# Patient Record
Sex: Male | Born: 1950 | Race: White | Hispanic: No | Smoking: Never smoker
Health system: Southern US, Community
[De-identification: ages and names within clinical notes are randomized; demographics above are authoritative.]

## PROBLEM LIST (undated history)

## (undated) HISTORY — PX: NO PAST SURGERIES: SHX2092

---

## 2007-01-19 ENCOUNTER — Ambulatory Visit: Payer: Self-pay | Admitting: Gastroenterology

## 2011-09-22 ENCOUNTER — Ambulatory Visit: Payer: Self-pay | Admitting: Family Medicine

## 2013-06-27 IMAGING — CR DG SHOULDER 3+V*L*
1 series · 3 of 3 positions shown · non-contrast
Comparison: none

REASON FOR EXAM: pain strain
COMMENTS:

PROCEDURE:     KDR - KDXR SHOULDER LEFT COMPLETE  - September 22, 2011  [DATE]
RESULT:     Comparison: None.

[Series 1: external rotate · 0.17mm/px · 3 of 3 slices shown]
[im 1/3]
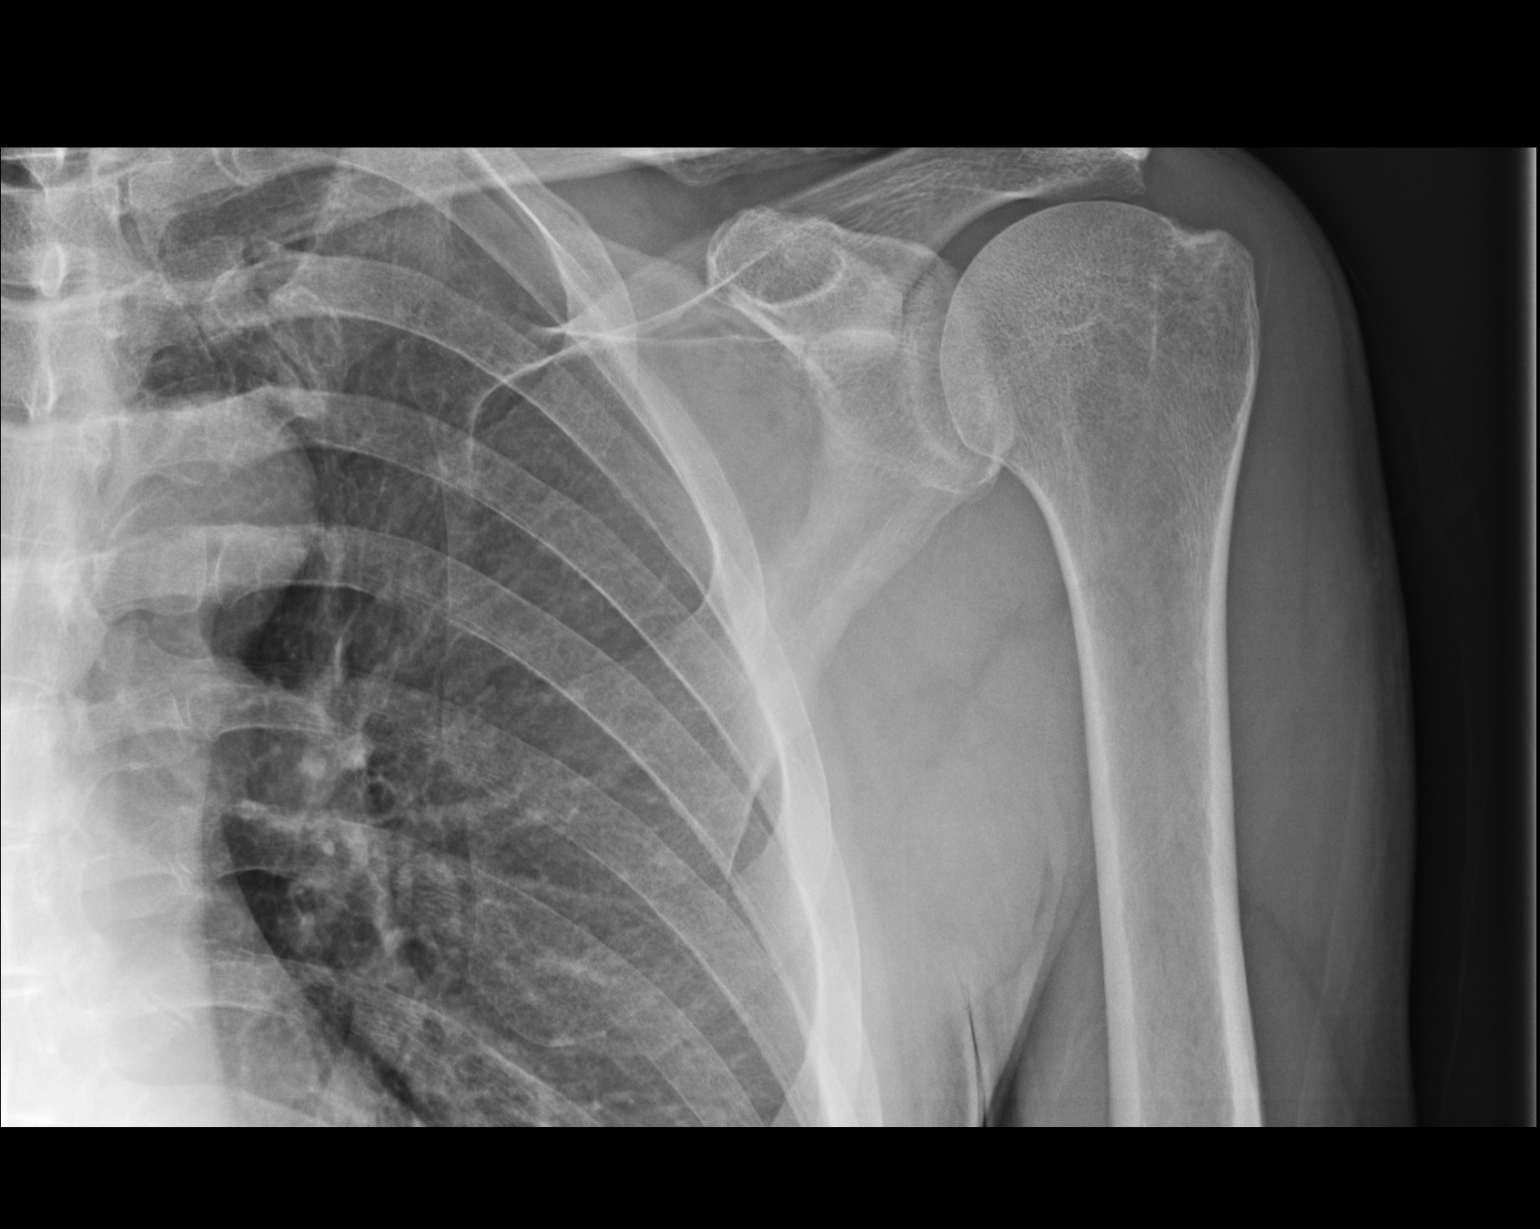
[im 2/3]
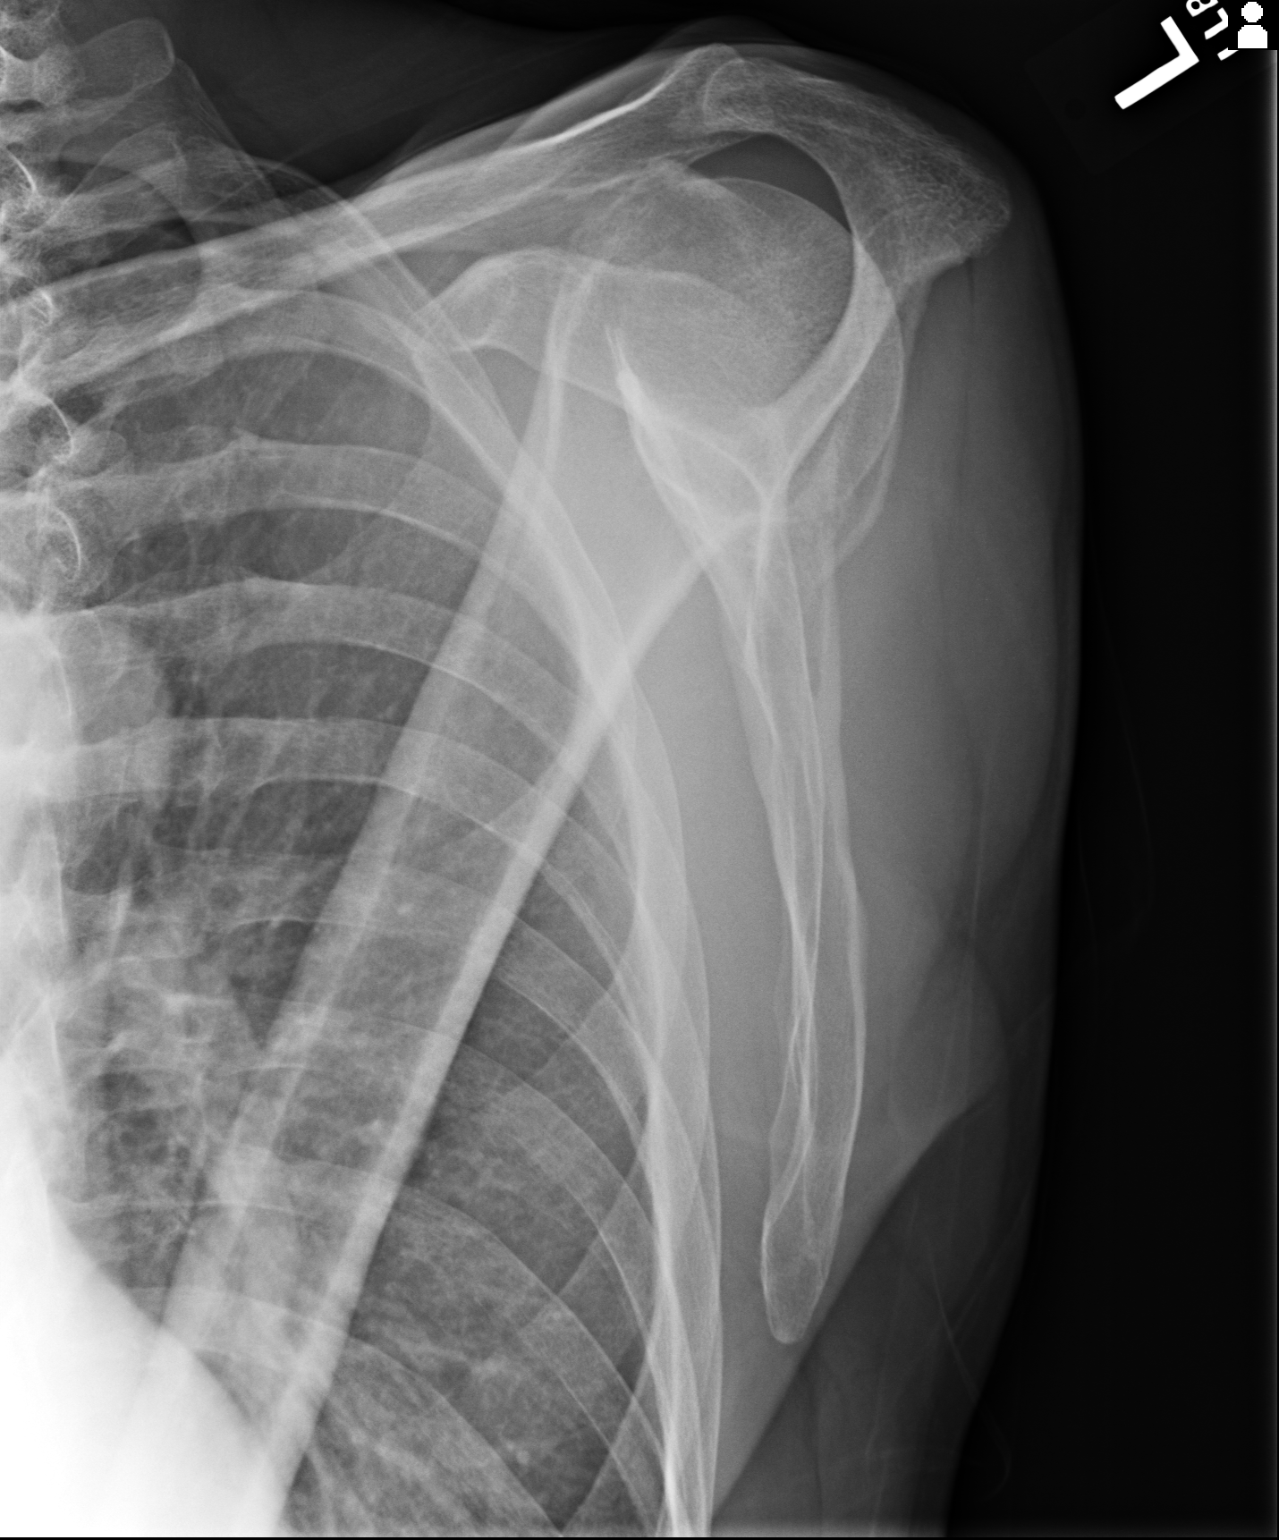
[im 3/3]
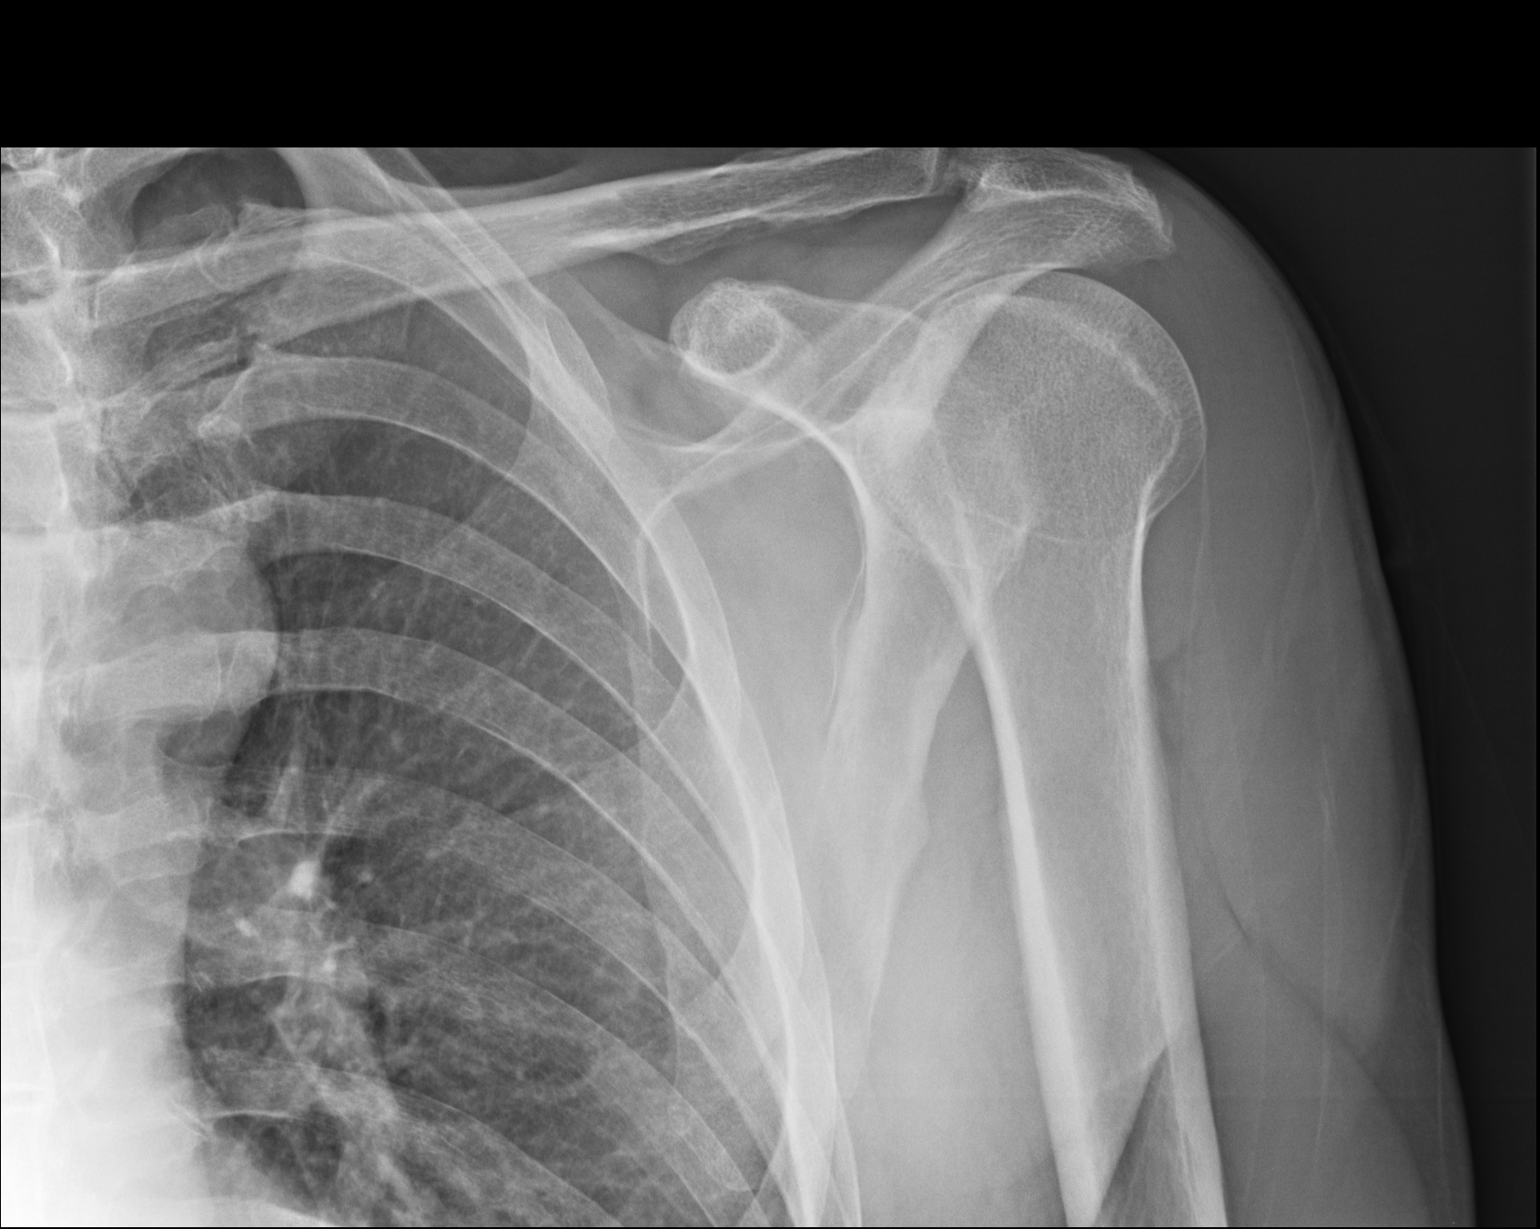

[3 of 3 positions shown; findings below may reference images not displayed]

FINDINGS: There is mild degenerative change of the acromioclavicular joint. No acute
fracture or dislocation.
IMPRESSION: Mild degenerative change of the acromioclavicular joint.

[REDACTED]

## 2013-06-27 IMAGING — CR DG FOREARM 2V*L*
1 series · 2 of 2 positions shown · non-contrast
Comparison: none

REASON FOR EXAM: bony abnormality
COMMENTS:

PROCEDURE:     KDR - KDXR FOREARM LEFT  - September 22, 2011  [DATE]
RESULT:     Comparison: None.

[Series 1: ap · 0.17mm/px · 2 of 2 slices shown]
[im 1/2]
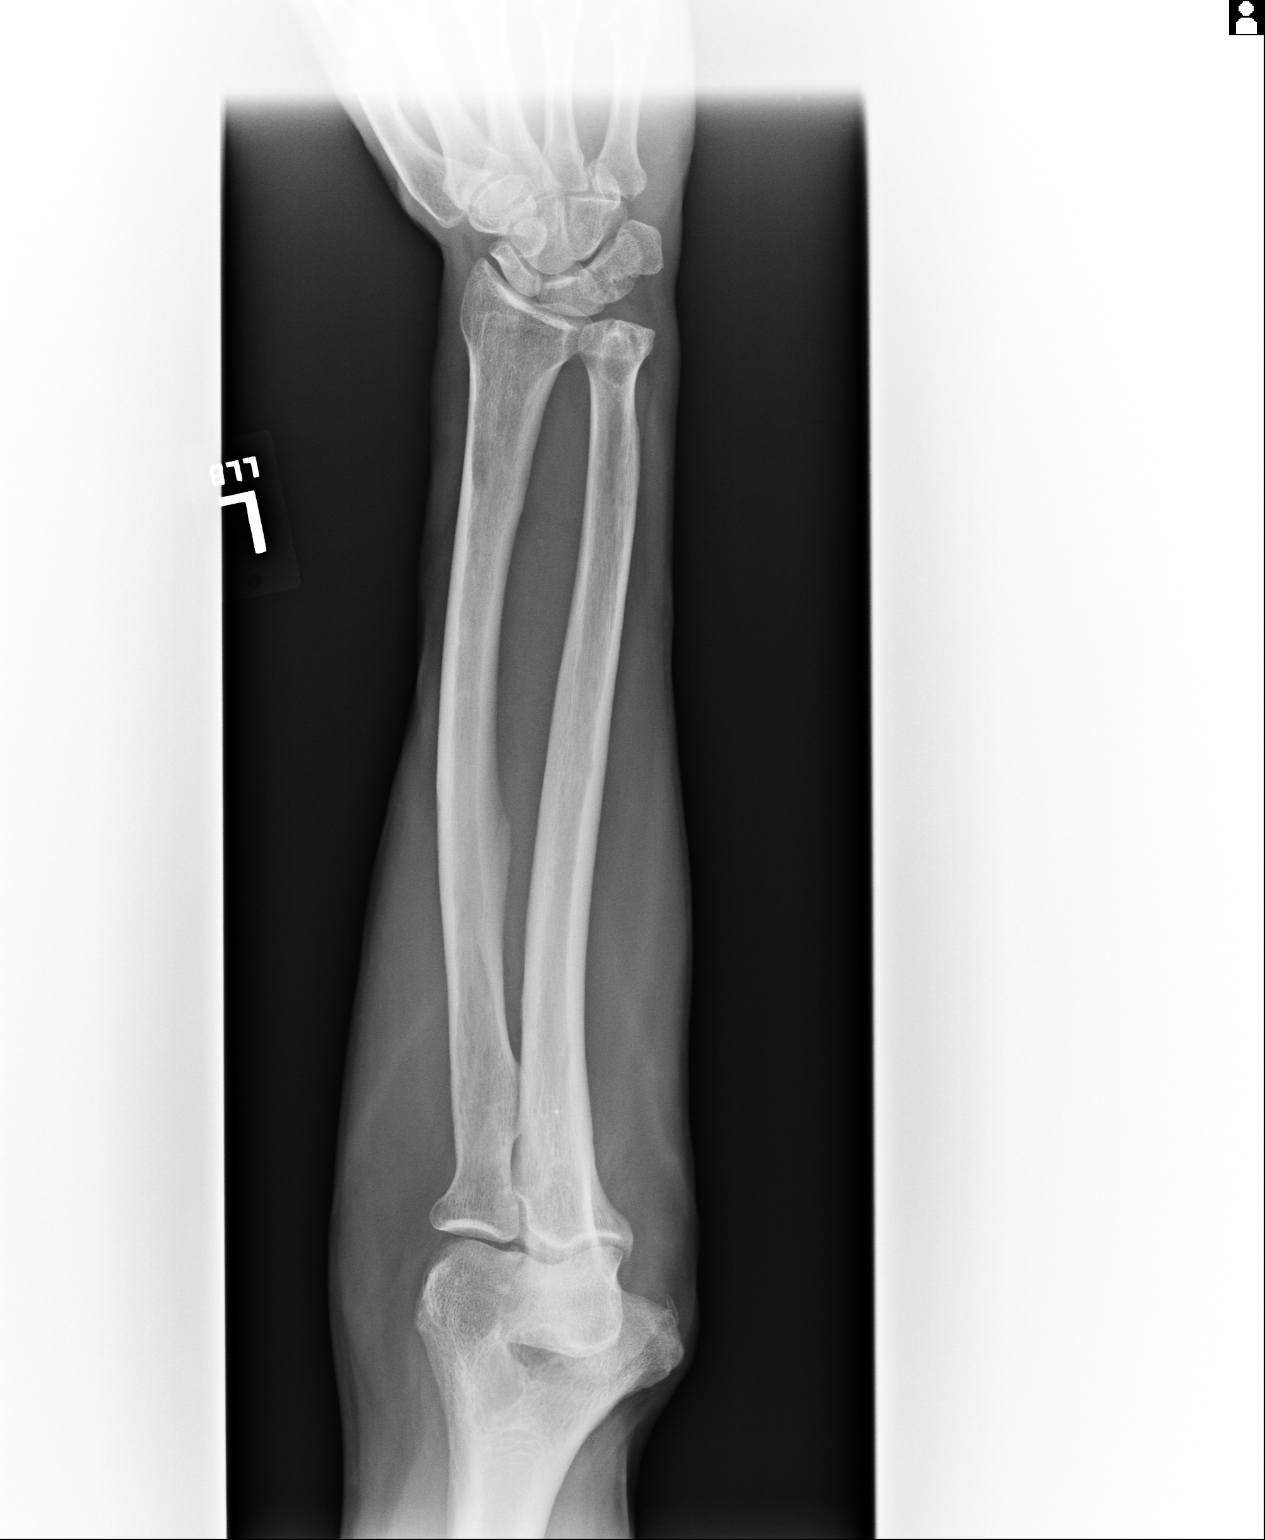
[im 2/2]
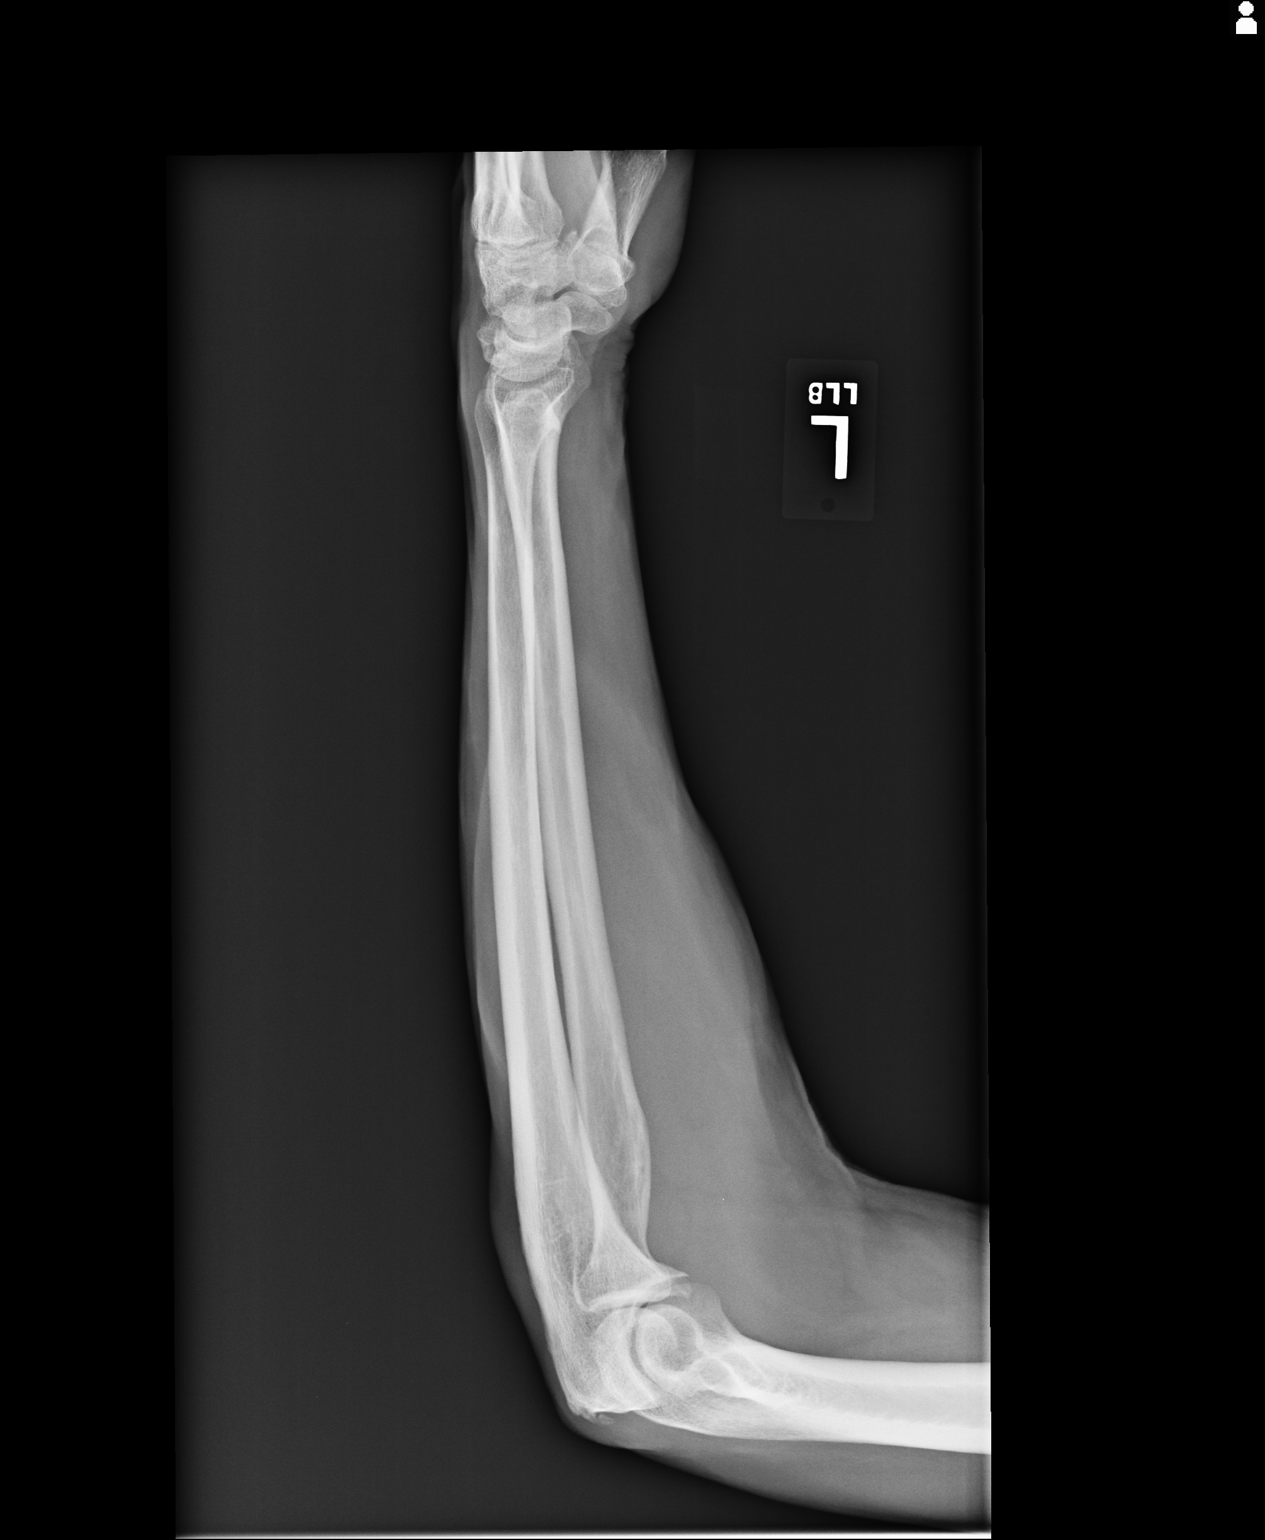

[2 of 2 positions shown; findings below may reference images not displayed]

FINDINGS: No acute fracture. There is some truncation of the ulnar styloid. Round area
of central lucency in the distal ulna is likely related to artifact from
overlying mild proliferative changes. However, if there is clinical concern
referable to this region of the distal ulna, dedicated wrist radiographs
would be recommended.
IMPRESSION: Please see above.

[REDACTED]

## 2015-03-13 ENCOUNTER — Telehealth: Payer: Self-pay | Admitting: Family Medicine

## 2015-03-13 NOTE — Telephone Encounter (Signed)
Pt has not been seen since 09/21/2011 and is requesting to reestablish and get a CPE.  Pt has not seen any other PCP.  No Rx.  [employer].  NW#[phone]/VHCB#[phone]/MW

## 2015-03-14 NOTE — Telephone Encounter (Signed)
Please review. KW 

## 2015-03-14 NOTE — Telephone Encounter (Signed)
Left message for pt to call back/MW °

## 2015-03-14 NOTE — Telephone Encounter (Signed)
Appointment scheduled for 03/21/2015/MW

## 2015-03-14 NOTE — Telephone Encounter (Signed)
Ok to re-establish with 45 minute physical slot

## 2015-03-21 ENCOUNTER — Ambulatory Visit: Payer: Self-pay | Admitting: Family Medicine

## 2015-03-22 ENCOUNTER — Ambulatory Visit (INDEPENDENT_AMBULATORY_CARE_PROVIDER_SITE_OTHER): Payer: 59 | Admitting: Family Medicine

## 2015-03-22 ENCOUNTER — Encounter: Payer: Self-pay | Admitting: Family Medicine

## 2015-03-22 VITALS — BP 110/76 | HR 74 | Temp 98.6°F | Resp 16 | Ht 69.0 in | Wt 157.2 lb

## 2015-03-22 DIAGNOSIS — Z7189 Other specified counseling: Secondary | ICD-10-CM | POA: Diagnosis not present

## 2015-03-22 DIAGNOSIS — Z7689 Persons encountering health services in other specified circumstances: Secondary | ICD-10-CM

## 2015-03-22 NOTE — Patient Instructions (Signed)
Follow up when you are ready for more of a physical, lab work and immunizations.

## 2015-03-22 NOTE — Progress Notes (Signed)
Subjective:     Patient ID: Samuel Solis, male   DOB: 07/09/1950, 65 y.o.   MRN: [id number]  HPI  Chief Complaint  Patient presents with  . New Patient (Initial Visit)    Patient returns back to office to reestablish patient care. Patient states that he has had no PCP since last being seen in our office in 2013, patient reports that he has no questions or concerns that he needs to address today. Patient declined both Td vaccine and Flu, patient states he will update Td vaccine at next office visit.   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 Reports he has two adult daughters and one granddaughter he sees frequently.   Review of Systems General: Feeling well, Defers influenza, Td, and Zostavax. HEENT: regular dental visits; wears reading glasses but no recent exam Cardiovascular: no chest pain, shortness of breath, or palpitations GI: no heartburn, no change in bowel habits. Due screening colonoscopy in 2018. GU: nocturia x 1, no change in bladder habits  Psychiatric: not depressed Musculoskeletal: Left shoulder injury playing hockey in October which is just now starting to feel better. States he fell onto his shoulder but did not seek medical evaluation.    Objective:   Physical Exam  Constitutional: He appears well-developed and well-nourished. No distress.  Cardiovascular: Normal rate and regular rhythm.   Pulmonary/Chest: Breath sounds normal.  Musculoskeletal: He exhibits no edema (of lower extremities).       Assessment:    1. Encounter to establish care     Plan:    Will f/u as needed and for physical when he is ready.

## 2016-06-02 ENCOUNTER — Telehealth: Payer: Self-pay | Admitting: Family Medicine

## 2017-01-05 ENCOUNTER — Ambulatory Visit (INDEPENDENT_AMBULATORY_CARE_PROVIDER_SITE_OTHER): Payer: Self-pay | Admitting: Family Medicine

## 2017-01-05 ENCOUNTER — Encounter: Payer: Self-pay | Admitting: Family Medicine

## 2017-01-05 VITALS — BP 122/68 | HR 61 | Temp 98.0°F | Resp 16 | Wt 154.4 lb

## 2017-01-05 DIAGNOSIS — L84 Corns and callosities: Secondary | ICD-10-CM

## 2017-01-05 NOTE — Patient Instructions (Signed)
Cleanse left foot with soap and water and apply bandaid until small cuts heal.

## 2017-01-05 NOTE — Progress Notes (Signed)
Subjective:     Patient ID: Samuel Solis, male   DOB: 08/08/1950, 66 y.o.   MRN: [id number]  HPI  Chief Complaint  Patient presents with  . Skin Problem    Patient comes in office today with concerns of a possible "bump" on the bottom of his left foot that has been present for the more than 2 months. Patient denies area growing in size but states that it is painful to walk.   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 Likes to play hockey when not working. Has used shoe inserts with improvement in his sx. Defers flu vaccine.  Review of Systems     Objective:   Physical Exam  Constitutional: He appears well-developed and well-nourished. No distress.  Skin:  Left plantar foot with two areas of scaling and tenderness. First is in the first metatarsal area and second is lateral to the first with a core appreciated.  Both areas pared down with a scalpel with two nicks during the procedure. Bleeding controlled with pressure and application of large band-aid.       Assessment:    1. Plantar callus with metatarsalgia     Plan:    cleanse foot with soap and water and apply band-aids until healed. Continue shoe inserts. Consider podiatry evaluation.

## 2017-01-08 ENCOUNTER — Telehealth: Payer: Self-pay | Admitting: Family Medicine

## 2017-01-08 NOTE — Telephone Encounter (Signed)
Pt stated he was returning Bob's call and wanted to let Nadine CountsBob know his feet feel much better and are doing just fine. Please advise. Thanks TNP

## 2017-01-08 NOTE — Telephone Encounter (Signed)
See below please-Samuel Solis V [name], RMA  

## 2017-01-26 ENCOUNTER — Ambulatory Visit: Payer: Self-pay | Admitting: Family Medicine

## 2017-01-26 ENCOUNTER — Encounter: Payer: Self-pay | Admitting: Family Medicine

## 2017-01-26 VITALS — BP 126/76 | HR 69 | Temp 98.0°F | Resp 16 | Wt 154.6 lb

## 2017-01-26 DIAGNOSIS — R202 Paresthesia of skin: Secondary | ICD-10-CM

## 2017-01-26 LAB — POCT GLYCOSYLATED HEMOGLOBIN (HGB A1C): Hemoglobin A1C: 5.4

## 2017-01-26 NOTE — Patient Instructions (Signed)
Try  Eucerin cream mixed in equal proportions with clotrimazole cream twice daily.

## 2017-01-26 NOTE — Progress Notes (Signed)
Subjective:     Patient ID: Samuel Solis, male   DOB: 12/10/1950, 66 y.o.   MRN: [id number] HPI  Chief Complaint  Patient presents with  . Foot Pain    Patient returns to office for follow up, patient was last seen 01/05/17 with plantar callus with metatarsalgia. Patient states since warts were removed from foot he noticed slight improvement but notices now that he has a burning sensation in his foot.   Denies injury or hx of diabetes.   Review of Systems     Objective:   Physical Exam  Constitutional: He appears well-developed and well-nourished. No distress.  Skin:  Left plantar foot with mild tenderness over the third and fourth metatarsals. There is also skin scaling/sloughing across his metatarsal area.       Assessment:    1. Paresthesias: left foot - POCT glycosylated hemoglobin (Hb A1C)    Plan:    Try antifungal combined with a moisturizing cream. Deferx-ray at this time per patient preference.

## 2017-02-11 ENCOUNTER — Encounter: Payer: Self-pay | Admitting: Podiatry

## 2017-02-11 ENCOUNTER — Ambulatory Visit (INDEPENDENT_AMBULATORY_CARE_PROVIDER_SITE_OTHER): Payer: Medicare Other | Admitting: Podiatry

## 2017-02-11 VITALS — BP 110/74 | HR 61

## 2017-02-11 DIAGNOSIS — T25122A Burn of first degree of left foot, initial encounter: Secondary | ICD-10-CM

## 2017-02-11 DIAGNOSIS — Q828 Other specified congenital malformations of skin: Secondary | ICD-10-CM

## 2017-02-11 NOTE — Progress Notes (Signed)
   Subjective:    Patient ID: Samuel JordanJohn T Mohar, male    DOB: 08/30/1950, 66 y.o.   MRN: [id number]  HPI this patient presents to the office with chief complaint of pain noted on the bottom of his left forefoot.  He states that he is been having problems with this forefoot pain for approximately 2 months.  He says it started out as a painful callus are he applied liquid to the skin. He says that forefoot area. Then started burning.  He went to his medical doctor who debrided some tissue from his left forefoot which helped improve his foot.  He says that the pain has now returned and he presents the office today with drainage tion and treatment of his feet.     Review of Systems  All other systems reviewed and are negative.      Objective:   Physical Exam General Appearance  Alert, conversant and in no acute stress.  Vascular  Dorsalis pedis and posterior pulses are palpable  bilaterally.  Capillary return is within normal limits  Bilaterally. Temperature is within normal limits  Bilaterally  Neurologic  Senn-Weinstein monofilament wire test within normal limits  bilaterally. Muscle power  Within normal limits bilaterally.  Nails Thick disfigured discolored nails with subungual debride bilaterally from hallux to fifth toes bilaterally. No evidence of bacterial infection or drainage bilaterally.  Orthopedic  No limitations of motion of motion feet bilaterally.  No crepitus or effusions noted.  No bony pathology or digital deformities noted.  Skin  Significant peeling and erythema noted on the forefoot bilaterally.  Painful callus area on the left forefoot.  No evidence of any infection or drainage noted.        Assessment & Plan:  Burn forefeet  IE  Debride necrotic tissue/callus left forefoot..  Silvadene/DSD/padding  Left forefoot.  RTC prn   Helane GuntherGregory Judia Arnott DPM

## 2020-07-03 ENCOUNTER — Other Ambulatory Visit: Payer: Self-pay

## 2020-07-03 ENCOUNTER — Encounter: Payer: Self-pay | Admitting: Family Medicine

## 2020-07-03 ENCOUNTER — Ambulatory Visit (INDEPENDENT_AMBULATORY_CARE_PROVIDER_SITE_OTHER): Payer: Self-pay | Admitting: Family Medicine

## 2020-07-03 VITALS — BP 136/81 | HR 62 | Temp 98.5°F | Ht 71.0 in | Wt 175.0 lb

## 2020-07-03 DIAGNOSIS — Z0000 Encounter for general adult medical examination without abnormal findings: Secondary | ICD-10-CM

## 2020-07-03 DIAGNOSIS — L600 Ingrowing nail: Secondary | ICD-10-CM

## 2020-07-03 DIAGNOSIS — Z1211 Encounter for screening for malignant neoplasm of colon: Secondary | ICD-10-CM

## 2020-07-03 DIAGNOSIS — Z6824 Body mass index (BMI) 24.0-24.9, adult: Secondary | ICD-10-CM

## 2020-07-03 NOTE — Patient Instructions (Addendum)
Needs Pneumonia and tetanus vaccine at pharmacy    Health Maintenance After Age 70 After age 2, you are at a higher risk for certain long-term diseases and infections as well as injuries from falls. Falls are a major cause of broken bones and head injuries in people who are older than age 22. Getting regular preventive care can help to keep you healthy and well. Preventive care includes getting regular testing and making lifestyle changes as recommended by your health care provider. Talk with your health care provider about:  Which screenings and tests you should have. A screening is a test that checks for a disease when you have no symptoms.  A diet and exercise plan that is right for you. What should I know about screenings and tests to prevent falls? Screening and testing are the best ways to find a health problem early. Early diagnosis and treatment give you the best chance of managing medical conditions that are common after age 58. Certain conditions and lifestyle choices may make you more likely to have a fall. Your health care provider may recommend:  Regular vision checks. Poor vision and conditions such as cataracts can make you more likely to have a fall. If you wear glasses, make sure to get your prescription updated if your vision changes.  Medicine review. Work with your health care provider to regularly review all of the medicines you are taking, including over-the-counter medicines. Ask your health care provider about any side effects that may make you more likely to have a fall. Tell your health care provider if any medicines that you take make you feel dizzy or sleepy.  Osteoporosis screening. Osteoporosis is a condition that causes the bones to get weaker. This can make the bones weak and cause them to break more easily.  Blood pressure screening. Blood pressure changes and medicines to control blood pressure can make you feel dizzy.  Strength and balance checks. Your health  care provider may recommend certain tests to check your strength and balance while standing, walking, or changing positions.  Foot health exam. Foot pain and numbness, as well as not wearing proper footwear, can make you more likely to have a fall.  Depression screening. You may be more likely to have a fall if you have a fear of falling, feel emotionally low, or feel unable to do activities that you used to do.  Alcohol use screening. Using too much alcohol can affect your balance and may make you more likely to have a fall. What actions can I take to lower my risk of falls? General instructions  Talk with your health care provider about your risks for falling. Tell your health care provider if: ? You fall. Be sure to tell your health care provider about all falls, even ones that seem minor. ? You feel dizzy, sleepy, or off-balance.  Take over-the-counter and prescription medicines only as told by your health care provider. These include any supplements.  Eat a healthy diet and maintain a healthy weight. A healthy diet includes low-fat dairy products, low-fat (lean) meats, and fiber from whole grains, beans, and lots of fruits and vegetables. Home safety  Remove any tripping hazards, such as rugs, cords, and clutter.  Install safety equipment such as grab bars in bathrooms and safety rails on stairs.  Keep rooms and walkways well-lit. Activity  Follow a regular exercise program to stay fit. This will help you maintain your balance. Ask your health care provider what types of exercise are appropriate  for you.  If you need a cane or walker, use it as recommended by your health care provider.  Wear supportive shoes that have nonskid soles.   Lifestyle  Do not drink alcohol if your health care provider tells you not to drink.  If you drink alcohol, limit how much you have: ? 0-1 drink a day for women. ? 0-2 drinks a day for men.  Be aware of how much alcohol is in your drink. In  the [place] one drink equals one typical bottle of beer (12 oz), one-half glass of wine (5 oz), or one shot of hard liquor (1 oz).  Do not use any products that contain nicotine or tobacco, such as cigarettes and e-cigarettes. If you need help quitting, ask your health care provider. Summary  Having a healthy lifestyle and getting preventive care can help to protect your health and wellness after age 53.  Screening and testing are the best way to find a health problem early and help you avoid having a fall. Early diagnosis and treatment give you the best chance for managing medical conditions that are more common for people who are older than age 70.  Falls are a major cause of broken bones and head injuries in people who are older than age 10. Take precautions to prevent a fall at home.  Work with your health care provider to learn what changes you can make to improve your health and wellness and to prevent falls. This information is not intended to replace advice given to you by your health care provider. Make sure you discuss any questions you have with your health care provider. Document Revised: 06/16/2018 Document Reviewed: 01/06/2017 Elsevier Patient Education  2021 [employer].

## 2020-07-03 NOTE — Progress Notes (Signed)
Annual Wellness Visit     Patient: Samuel Solis, Male    DOB: 28-Nov-1950, 70 y.o.   MRN: [id number] Visit Date: 07/03/2020  Today's Provider: Azalee Course Tyrae Alcoser, FNP   Chief Complaint  Patient presents with  . Annual Exam   Subjective    Samuel Solis is a 70 y.o. male who presents today for his Annual Wellness Visit. He reports consuming a general diet.  He generally feels well. He reports sleeping well. He does have additional problems to discuss today.   HPI   Pt is concerned his right big toe nail is ingrown  Denies any specialists, UC, ED, or surgeries Has not been to the dentist or eye doctor in the past years  Had a colonoscopy 5 years ago Was told to go in 5 years again, not sure why  Not sure if had chicken pox, will check antibody to see if a candidate for shingles vaccine  Works at [employer]  There are no problems to display for this patient.  History reviewed. No pertinent past medical history. Social History   Tobacco Use  . Smoking status: Never Smoker  . Smokeless tobacco: Never Used  Vaping Use  . Vaping Use: Never used  Substance Use Topics  . Alcohol use: No    Alcohol/week: 0.0 standard drinks  . Drug use: No   No Known Allergies   Medications: No outpatient medications prior to visit.   No facility-administered medications prior to visit.    No Known Allergies  Patient Care Team: Chrismon, Jodell Cipro, PA-C as PCP - General (Family Medicine)  Review of Systems  Constitutional: Negative.   HENT: Negative.   Eyes: Negative.   Respiratory: Negative.   Cardiovascular: Negative.   Gastrointestinal: Negative.   Endocrine: Negative.   Genitourinary: Negative.   Musculoskeletal: Negative.   Skin: Negative.   Allergic/Immunologic: Negative.   Neurological: Negative.   Hematological: Negative.   Psychiatric/Behavioral: Negative.         Objective    Vitals: BP 136/81 (BP Location: Right Arm, Patient Position: Sitting, Cuff Size:  Large)   Pulse 62   Temp 98.5 F (36.9 C) (Oral)   Ht 5\' 11"  (1.803 m)   Wt 175 lb (79.4 kg)   BMI 24.41 kg/m    Physical Exam Constitutional:      General: He is not in acute distress.    Appearance: Normal appearance. He is normal weight. He is not ill-appearing.  HENT:     Head: Normocephalic.     Right Ear: Tympanic membrane and ear canal normal.     Left Ear: Tympanic membrane and ear canal normal.     Nose: Nose normal.     Mouth/Throat:     Mouth: Mucous membranes are moist.     Dentition: Normal dentition.     Pharynx: Oropharynx is clear. No pharyngeal swelling, oropharyngeal exudate or posterior oropharyngeal erythema.  Eyes:     Extraocular Movements: Extraocular movements intact.     Conjunctiva/sclera: Conjunctivae normal.     Pupils: Pupils are equal, round, and reactive to light.  Neck:     Thyroid: No thyroid mass, thyromegaly or thyroid tenderness.  Cardiovascular:     Rate and Rhythm: Normal rate and regular rhythm.     Pulses: Normal pulses.     Heart sounds: Normal heart sounds. No murmur heard. No friction rub. No gallop.   Pulmonary:     Effort: Pulmonary effort is normal. No respiratory distress.  Breath sounds: Normal breath sounds.  Abdominal:     General: Bowel sounds are normal.     Palpations: Abdomen is soft.     Tenderness: There is no abdominal tenderness.     Hernia: No hernia is present.  Musculoskeletal:        General: No tenderness, deformity or signs of injury. Normal range of motion.     Cervical back: Normal range of motion.  Feet:     Right foot:     Skin integrity: Erythema (on Great toe) present.     Toenail Condition: Right toenails are ingrown.     Left foot:     Toenail Condition: Left toenails are normal.  Lymphadenopathy:     Cervical: No cervical adenopathy.  Skin:    General: Skin is warm and dry.  Neurological:     Mental Status: He is alert and oriented to person, place, and time.  Psychiatric:        Mood  and Affect: Mood normal.        Behavior: Behavior normal.      Most recent functional status assessment: In your present state of health, do you have any difficulty performing the following activities: 07/03/2020  Hearing? N  Vision? N  Difficulty concentrating or making decisions? N  Walking or climbing stairs? N  Dressing or bathing? N  Doing errands, shopping? N  Some recent data might be hidden   Most recent fall risk assessment: Fall Risk  07/03/2020  Falls in the past year? 0  Number falls in past yr: 0  Injury with Fall? 0  Risk for fall due to : No Fall Risks  Follow up Falls evaluation completed    Most recent depression screenings: PHQ 2/9 Scores 07/03/2020  PHQ - 2 Score 0  PHQ- 9 Score 0   Most recent cognitive screening: No flowsheet data found. Most recent Audit-C alcohol use screening Alcohol Use Disorder Test (AUDIT) 07/03/2020  1. How often do you have a drink containing alcohol? 0  2. How many drinks containing alcohol do you have on a typical day when you are drinking? 0  3. How often do you have six or more drinks on one occasion? 0  AUDIT-C Score 0   A score of 3 or more in women, and 4 or more in men indicates increased risk for alcohol abuse, EXCEPT if all of the points are from question 1   No results found for any visits on 07/03/20.  Assessment & Plan     Annual wellness visit done today including the all of the following: Reviewed patient's Family Medical History Reviewed and updated list of patient's medical providers Assessment of cognitive impairment was done Assessed patient's functional ability Established a written schedule for health screening services Health Risk Assessent Completed and Reviewed  Exercise Activities and Dietary recommendations Goals   None     Immunization History  Administered Date(s) Administered  . Moderna SARS-COV2 Booster Vaccination 05/27/2019, 06/24/2019, 02/08/2020  . Tdap 10/27/2004    Health  Maintenance  Topic Date Due  . COLONOSCOPY (Pts 45-18yrs Insurance coverage will need to be confirmed)  Never done  . TETANUS/TDAP  10/28/2014  . PNA vac Low Risk Adult (1 of 2 - PCV13) Never done  . INFLUENZA VACCINE  10/07/2020  . COVID-19 Vaccine  Completed  . Hepatitis C Screening  Completed  . HPV VACCINES  Aged Out     Discussed health benefits of physical activity, and encouraged him to  engage in regular exercise appropriate for his age and condition.    Problem List Items Addressed This Visit   None   Visit Diagnoses    Annual physical exam    -  Primary   Relevant Orders   CBC with Differential   Comprehensive metabolic panel   Lipid Panel   TSH   Hepatitis C antibody   PSA   Varicella zoster antibody, IgG   Body mass index (BMI) 24.0-24.9, adult        Relevant Orders   CBC with Differential   Colon cancer screening       Relevant Orders   Ambulatory referral to Gastroenterology   Ingrown toenail of right foot       Relevant Orders   Ambulatory referral to Podiatry     Plan  Encouraged to get TDAP and pneumonia vaccine at pharmacy  Referral placed to GI for colonoscopy  Referral placed to podiatry for ingrown toenail: continue soaks and antibiotic ointment  Will follow up with lab results  Will follow up if needs shingrix  Encouraged to go to the dentist and eye doctor  Return in about 6 months (around 01/02/2021).       Azalee Course Lewis Keats, FNP  [facility] [phone] (phone) [phone] (fax)  [facility]

## 2020-07-04 ENCOUNTER — Encounter: Payer: Self-pay | Admitting: Family Medicine

## 2020-07-22 ENCOUNTER — Encounter: Payer: Self-pay | Admitting: Podiatry

## 2020-07-22 ENCOUNTER — Other Ambulatory Visit: Payer: Self-pay

## 2020-07-22 ENCOUNTER — Ambulatory Visit (INDEPENDENT_AMBULATORY_CARE_PROVIDER_SITE_OTHER): Payer: 59 | Admitting: Podiatry

## 2020-07-22 DIAGNOSIS — L600 Ingrowing nail: Secondary | ICD-10-CM | POA: Diagnosis not present

## 2020-07-22 MED ORDER — CEPHALEXIN 500 MG PO CAPS: 500.0000 mg | ORAL_CAPSULE | Freq: Three times a day (TID) | ORAL | 0 refills | Status: AC

## 2020-07-22 NOTE — Progress Notes (Signed)
  Subjective:  Patient ID: Samuel Solis, male    DOB: 1950-04-03,  MRN: [id number]  Chief Complaint  Patient presents with  . Ingrown Toenail    Patient presents today for ingrown toenail left hallux bilat borders x 3-4 weeks    70 y.o. male presents with the above complaint. History confirmed with patient.   Objective:  Physical Exam: warm, good capillary refill, no trophic changes or ulcerative lesions, normal DP and PT pulses and normal sensory exam. Left Foot: Ingrown medial hallux with paronychia and large granuloma Assessment:   1. Ingrowing left great toenail      Plan:  Patient was evaluated and treated and all questions answered.    Ingrown Nail, left -Patient elects to proceed with minor surgery to remove ingrown toenail today. Consent reviewed and signed by patient. -Ingrown nail excised. See procedure note. -Educated on post-procedure care including soaking. Written instructions provided and reviewed. -Rx for Keflex 5-day sent to pharmacy  Procedure: Excision of Ingrown Toenail Location: Left 1st toe medial nail borders. Anesthesia: Lidocaine 1% plain; 1.5 mL and Marcaine 0.5% plain; 1.5 mL, digital block. Skin Prep: Betadine. Dressing: Silvadene; telfa; dry, sterile, compression dressing. Technique: Following skin prep, the toe was exsanguinated and a tourniquet was secured at the base of the toe. The affected nail border was freed, split with a nail splitter, and excised.  The tourniquet was then removed and sterile dressing applied. Disposition: Patient tolerated procedure well.   Return if symptoms worsen or fail to improve.

## 2020-07-22 NOTE — Patient Instructions (Addendum)

## 2020-08-01 ENCOUNTER — Other Ambulatory Visit: Payer: Self-pay

## 2020-08-01 ENCOUNTER — Encounter: Payer: Self-pay | Admitting: Nurse Practitioner

## 2020-08-01 ENCOUNTER — Ambulatory Visit (INDEPENDENT_AMBULATORY_CARE_PROVIDER_SITE_OTHER): Payer: Self-pay | Admitting: Nurse Practitioner

## 2020-08-01 VITALS — BP 133/81 | HR 77 | Temp 97.6°F | Ht 69.61 in | Wt 171.0 lb

## 2020-08-01 DIAGNOSIS — R1909 Other intra-abdominal and pelvic swelling, mass and lump: Secondary | ICD-10-CM

## 2020-08-01 NOTE — Progress Notes (Signed)
Acute Office Visit  Subjective:    Patient ID: Samuel Solis, male    DOB: [birth year], 70 y.o.   MRN: [id number]  Chief Complaint  Patient presents with  . knot in groin    For 1 week, no pain     HPI Patient is in today for a knot in his left groin.  GROIN SWELLING  Duration: 1 week Dysuria: no Urinary frequency: yes Urgency: yes--not new Small volume voids: no Symptom severity: mild Urinary incontinence: no Foul odor: no Hematuria: no Abdominal pain:  no Back pain: no Suprapubic pain/pressure: no Flank pain: no Fever:  no Vomiting: no Penile discharge: no   History reviewed. No pertinent past medical history.  Past Surgical History:  Procedure Laterality Date  . NO PAST SURGERIES      Family History  Problem Relation Age of Onset  . Cancer Mother        lung  . Stroke Father   . Healthy Sister   . Healthy Brother   . Healthy Sister   . Healthy Brother   . Colon cancer Neg Hx   . Prostate cancer Neg Hx     Social History   Socioeconomic History  . Marital status: Divorced    Spouse name: Not on file  . Number of children: Not on file  . Years of education: Not on file  . Highest education level: Not on file  Occupational History  . Not on file  Tobacco Use  . Smoking status: Never Smoker  . Smokeless tobacco: Never Used  Vaping Use  . Vaping Use: Never used  Substance and Sexual Activity  . Alcohol use: No    Alcohol/week: 0.0 standard drinks  . Drug use: No  . Sexual activity: Never  Other Topics Concern  . Not on file  Social History Narrative  . Not on file   Social Determinants of Health   Financial Resource Strain: Not on file  Food Insecurity: Not on file  Transportation Needs: Not on file  Physical Activity: Not on file  Stress: Not on file  Social Connections: Not on file  Intimate Partner Violence: Not on file    No outpatient medications prior to visit.   No facility-administered medications prior to visit.     No Known Allergies  Review of Systems  Constitutional: Negative.   Respiratory: Negative.   Cardiovascular: Negative.   Genitourinary: Positive for frequency. Negative for hematuria, penile discharge, scrotal swelling and testicular pain.       Area of swelling to left groin, see HPI  Neurological: Negative.        Objective:    Physical Exam Vitals and nursing note reviewed. Exam conducted with a chaperone present.  Constitutional:      Appearance: Normal appearance.  HENT:     Head: Normocephalic.  Eyes:     Conjunctiva/sclera: Conjunctivae normal.  Musculoskeletal:     Cervical back: Normal range of motion.  Skin:    General: Skin is warm.     Comments: 6cm x 6cm area of edema to left pelvis. Not tender to touch. No erythema or warmth.  Neurological:     General: No focal deficit present.     Mental Status: He is alert and oriented to person, place, and time.  Psychiatric:        Mood and Affect: Mood normal.        Behavior: Behavior normal.        Thought Content: Thought content  normal.        Judgment: Judgment normal.     BP 133/81   Pulse 77   Temp 97.6 F (36.4 C) (Oral)   Ht 5' 9.61" (1.768 m)   Wt 171 lb (77.6 kg)   SpO2 95%   BMI 24.81 kg/m  Wt Readings from Last 3 Encounters:  08/01/20 171 lb (77.6 kg)  07/03/20 175 lb (79.4 kg)  01/26/17 154 lb 9.6 oz (70.1 kg)    There are no preventive care reminders to display for this patient.  There are no preventive care reminders to display for this patient.   No results found for: TSH No results found for: WBC, HGB, HCT, MCV, PLT No results found for: NA, K, CHLORIDE, CO2, GLUCOSE, BUN, CREATININE, BILITOT, ALKPHOS, AST, ALT, PROT, ALBUMIN, CALCIUM, ANIONGAP, EGFR, GFR No results found for: CHOL No results found for: HDL No results found for: LDLCALC No results found for: TRIG No results found for: CHOLHDL Lab Results  Component Value Date   HGBA1C 5.4 01/26/2017       Assessment &  Plan:   Problem List Items Addressed This Visit      Other   Lump in the groin - Primary    Area of edema noted to left pelvis. No tenderness, erythema, or warmth noted. He can take ibuprofen and use ice/heat to site. Will place order for ultrasound. Follow-up with PCP if symptoms worsen or do not improve.       Relevant Orders   US Abdomen Limited       No orders of the defined types were placed in this encounter.    Charyl Dancer, NP

## 2020-08-01 NOTE — Assessment & Plan Note (Signed)
Area of edema noted to left pelvis. No tenderness, erythema, or warmth noted. He can take ibuprofen and use ice/heat to site. Will place order for ultrasound. Follow-up with PCP if symptoms worsen or do not improve.

## 2020-08-01 NOTE — Patient Instructions (Signed)
It was great to see you!  You can try ice/heat along with ibuprofen. We will get an ultrasound of the area.  Take care,  Rodman Pickle, NP

## 2020-08-07 DIAGNOSIS — K4090 Unilateral inguinal hernia, without obstruction or gangrene, not specified as recurrent: Secondary | ICD-10-CM

## 2020-08-07 HISTORY — DX: Unilateral inguinal hernia, without obstruction or gangrene, not specified as recurrent: K40.90

## 2020-09-10 ENCOUNTER — Ambulatory Visit: Admission: RE | Admit: 2020-09-10 | Payer: Self-pay | Source: Ambulatory Visit

## 2020-09-10 ENCOUNTER — Other Ambulatory Visit: Payer: Self-pay

## 2020-09-17 ENCOUNTER — Encounter: Payer: Self-pay | Admitting: Emergency Medicine

## 2020-09-17 ENCOUNTER — Other Ambulatory Visit: Payer: Self-pay

## 2020-09-17 ENCOUNTER — Ambulatory Visit
Admission: EM | Admit: 2020-09-17 | Discharge: 2020-09-17 | Disposition: A | Discharge status: Home / Self Care | Payer: 59

## 2020-09-17 DIAGNOSIS — R1909 Other intra-abdominal and pelvic swelling, mass and lump: Secondary | ICD-10-CM | POA: Diagnosis not present

## 2020-09-17 NOTE — Discharge Instructions (Addendum)
Go to [facility] radiology for the ultrasound of your abdomen tomorrow morning at 8:45 am.      Do not eat or drink anything after midnight.

## 2020-09-17 NOTE — ED Provider Notes (Signed)
Samuel Solis    CSN: [id number] Arrival date & time: 09/17/20  1446      History   Chief Complaint Chief Complaint  Patient presents with   Abdominal Pain     HPI Samuel Solis is a 70 y.o. male.  Patient presents with 1-month history of soft swelling in his left lower abdomen/groin.  No falls or trauma.  He states the area is larger when he is in a standing position.  It is occasionally mildly uncomfortable but no acute pain currently.  He denies redness, wound, rash, fever, chills, abdominal pain, nausea, vomiting, diarrhea, constipation, dysuria, hematuria, penile discharge, testicular pain, or other symptoms.  He was seen by his PCP on 08/01/2020; diagnosed with lump in groin; treated symptomatically and PCP noted that they would order an ultrasound.Marland Kitchen  Ultrasound appointment appears to have been scheduled for 09/10/2020 but patient did not show for the appointment.  He states he did not know about the ultrasound appointment.  He denies other pertinent medical history.  The history is provided by the patient and medical records.   History reviewed. No pertinent past medical history.  Patient Active Problem List   Diagnosis Date Noted   Lump in the groin 08/01/2020    Past Surgical History:  Procedure Laterality Date   NO PAST SURGERIES         Home Medications    Prior to Admission medications   Not on File    Family History Family History  Problem Relation Age of Onset   Cancer Mother        lung   Stroke Father    Healthy Sister    Healthy Brother    Healthy Sister    Healthy Brother    Colon cancer Neg Hx    Prostate cancer Neg Hx     Social History Social History   Tobacco Use   Smoking status: Never   Smokeless tobacco: Never  Vaping Use   Vaping Use: Never used  Substance Use Topics   Alcohol use: No    Alcohol/week: 0.0 standard drinks   Drug use: No     Allergies   Patient has no known allergies.   Review of Systems Review  of Systems  Constitutional:  Negative for chills and fever.  Respiratory:  Negative for cough and shortness of breath.   Cardiovascular:  Negative for chest pain and palpitations.  Gastrointestinal:  Negative for abdominal pain, constipation, diarrhea and vomiting.  Genitourinary:  Positive for scrotal swelling. Negative for dysuria, hematuria, penile discharge and testicular pain.  Skin:  Negative for color change, rash and wound.  All other systems reviewed and are negative.   Physical Exam Triage Vital Signs ED Triage Vitals  Enc Vitals Group     BP      Pulse      Resp      Temp      Temp src      SpO2      Weight      Height      Head Circumference      Peak Flow      Pain Score      Pain Loc      Pain Edu?      Excl. in GC?    No data found.  Updated Vital Signs BP (!) 148/80 (BP Location: Left Arm)   Pulse 70   Temp 99.4 F (37.4 C) (Oral)   Resp 18   SpO2  94%   Visual Acuity Right Eye Distance:   Left Eye Distance:   Bilateral Distance:    Right Eye Near:   Left Eye Near:    Bilateral Near:     Physical Exam Vitals and nursing note reviewed.  Constitutional:      General: He is not in acute distress.    Appearance: He is well-developed.  HENT:     Head: Normocephalic and atraumatic.     Mouth/Throat:     Mouth: Mucous membranes are moist.  Eyes:     Conjunctiva/sclera: Conjunctivae normal.  Cardiovascular:     Rate and Rhythm: Normal rate and regular rhythm.     Heart sounds: Normal heart sounds.  Pulmonary:     Effort: Pulmonary effort is normal. No respiratory distress.     Breath sounds: Normal breath sounds.  Abdominal:     General: Bowel sounds are normal. There is no distension.     Palpations: Abdomen is soft.     Tenderness: There is no abdominal tenderness. There is no guarding or rebound.       Comments: Approximately 6 x 8 cm area of soft, nontender, flesh-colored swelling in left lower abdomen / groin which increases in  diameter when in standing position.  No rash, erythema, wounds, ecchymosis.   Musculoskeletal:     Cervical back: Neck supple.  Skin:    General: Skin is warm and dry.  Neurological:     General: No focal deficit present.     Mental Status: He is alert and oriented to person, place, and time.     Gait: Gait normal.  Psychiatric:        Mood and Affect: Mood normal.        Behavior: Behavior normal.     UC Treatments / Results  Labs (all labs ordered are listed, but only abnormal results are displayed) Labs Reviewed - No data to display  EKG   Radiology US PELVIS LIMITED (TRANSABDOMINAL ONLY)  Result Date: 09/18/2020 CLINICAL DATA:  Area of swelling in the groin. EXAM: LIMITED ULTRASOUND OF PELVIS TECHNIQUE: Limited transabdominal ultrasound examination of the pelvis was performed. COMPARISON:  None. FINDINGS: Area of soft tissue noted in the left groin most compatible with fat containing hernia. This measures 3.1 x 2.3 x 1.1 cm. No visible bowel. No other mass seen. IMPRESSION: Probable small  fat containing hernia in the left groin. Electronically Signed   By: Charlett Nose M.D.   On: 09/18/2020 08:56    Procedures Procedures (including critical care time)  Medications Ordered in UC Medications - No data to display  Initial Impression / Assessment and Plan / UC Course  I have reviewed the triage vital signs and the nursing notes.  Pertinent labs & imaging results that were available during my care of the patient were reviewed by me and considered in my medical decision making (see chart for details).   Left groin swelling.  Ultrasound scheduled for tomorrow morning at 8:45 AM.  Strict ED precautions discussed and education provided on hernias. Update: Ultrasound shows "probable small fat containing hernia in the left groin."  Called patient and discussed ultrasound findings.  Instructed him to follow-up with general surgeon as discussed.  Discussed ED precautions again.   Patient agrees to plan of care.   Final Clinical Impressions(s) / UC Diagnoses   Final diagnoses:  Groin swelling     Discharge Instructions      Go to [facility] radiology for the ultrasound of  your abdomen tomorrow morning at 8:45 am.      Do not eat or drink anything after midnight.           ED Prescriptions   None    PDMP not reviewed this encounter.   Mickie Bail, NP 09/18/20 [phone]

## 2020-09-17 NOTE — ED Triage Notes (Signed)
Triage by Provider Tresa Endo, NP.

## 2020-09-18 ENCOUNTER — Ambulatory Visit
Admission: RE | Admit: 2020-09-18 | Discharge: 2020-09-18 | Disposition: A | Discharge status: Home / Self Care | Payer: Medicare Other | Source: Ambulatory Visit | Attending: Emergency Medicine | Admitting: Emergency Medicine

## 2020-09-18 ENCOUNTER — Other Ambulatory Visit: Payer: Self-pay | Admitting: Emergency Medicine

## 2020-09-18 DIAGNOSIS — R1909 Other intra-abdominal and pelvic swelling, mass and lump: Secondary | ICD-10-CM

## 2020-09-20 ENCOUNTER — Ambulatory Visit: Payer: Self-pay | Admitting: General Surgery

## 2020-09-20 NOTE — H&P (Signed)
PATIENT PROFILE: Samuel Solis is a 70 y.o. male who presents to the Clinic for evaluation of left inguinal hernia.  PCP:  None  HISTORY OF PRESENT ILLNESS: Samuel Solis reports started having swelling of the left inguinal hernia since a month ago.  He denies any significant pain.  There is no radiation.  There is no alleviating or aggravating factor.  He reports that the swelling goes away when he lays down.  As soon as he stands up with the swelling comes back.  He denies any episode of the abdominal distention nausea or vomiting.  He denies any abdominal surgeries.  PROBLEM LIST: Left inguinal hernia  GENERAL REVIEW OF SYSTEMS:   General ROS: negative for - chills, fatigue, fever, weight gain or weight loss Allergy and Immunology ROS: negative for - hives  Hematological and Lymphatic ROS: negative for - bleeding problems or bruising, negative for palpable nodes Endocrine ROS: negative for - heat or cold intolerance, hair changes Respiratory ROS: negative for - cough, shortness of breath or wheezing Cardiovascular ROS: no chest pain or palpitations GI ROS: negative for nausea, vomiting, abdominal pain, diarrhea, constipation Musculoskeletal ROS: negative for - joint swelling or muscle pain Neurological ROS: negative for - confusion, syncope Dermatological ROS: negative for pruritus and rash Psychiatric: negative for anxiety, depression, difficulty sleeping and memory loss  MEDICATIONS: No current outpatient medications on file.   No current facility-administered medications for this visit.    ALLERGIES: Patient has no known allergies.  PAST MEDICAL HISTORY: History reviewed. No pertinent past medical history.  PAST SURGICAL HISTORY: History reviewed. No pertinent surgical history.   FAMILY HISTORY: Family History  Problem Relation Age of Onset   Lung cancer Mother    Lung cancer Father      SOCIAL HISTORY: Social History   Socioeconomic History   Marital status:  Divorced  Tobacco Use   Smoking status: Never Smoker   Smokeless tobacco: Never Used  Scientific laboratory technician Use: Never used  Substance and Sexual Activity   Alcohol use: Never   Drug use: Never    PHYSICAL EXAM: Vitals:   09/20/20 0843  BP: (!) 162/87  Pulse: 61   Body mass index is 23.43 kg/m. Weight: 76.2 kg (168 lb)   GENERAL: Alert, active, oriented x3  HEENT: Pupils equal reactive to light. Extraocular movements are intact. Sclera clear. Palpebral conjunctiva normal red color.Pharynx clear.  NECK: Supple with no palpable mass and no adenopathy.  LUNGS: Sound clear with no rales rhonchi or wheezes.  HEART: Regular rhythm S1 and S2 without murmur.  ABDOMEN: Soft and depressible, nontender with no palpable mass, no hepatomegaly.  Palpable and reducible left inguinal hernia with minimal tenderness to palpation.  EXTREMITIES: Well-developed well-nourished symmetrical with no dependent edema.  NEUROLOGICAL: Awake alert oriented, facial expression symmetrical, moving all extremities.  REVIEW OF DATA: I have reviewed the following data today: Initial consult on 09/20/2020  Component Date Value   WBC (White Blood Cell Co* 09/20/2020 4.7    RBC (Red Blood Cell Coun* 09/20/2020 4.85    Hemoglobin 09/20/2020 14.4    Hematocrit 09/20/2020 41.9    MCV (Mean Corpuscular Vo* 09/20/2020 86.4    MCH (Mean Corpuscular He* 09/20/2020 29.7    MCHC (Mean Corpuscular H* 09/20/2020 34.4    Platelet Count 09/20/2020 142 (!)   RDW-CV (Red Cell Distrib* 09/20/2020 13.6    MPV (Mean Platelet Volum* 09/20/2020 8.9 (!)   Neutrophils 09/20/2020 3.04    Lymphocytes 09/20/2020 1.05  Monocytes 09/20/2020 0.42    Eosinophils 09/20/2020 0.19    Basophils 09/20/2020 0.03    Neutrophil % 09/20/2020 64.1    Lymphocyte % 09/20/2020 22.2    Monocyte % 09/20/2020 8.9    Eosinophil % 09/20/2020 4.0    Basophil% 09/20/2020 0.6    Immature Granulocyte % 09/20/2020 0.2    Immature Granulocyte  Cou* 09/20/2020 0.01    Glucose 09/20/2020 99    Sodium 09/20/2020 142    Potassium 09/20/2020 4.4    Chloride 09/20/2020 107    Carbon Dioxide (CO2) 09/20/2020 33.1 (!)   Urea Nitrogen (BUN) 09/20/2020 18    Creatinine 09/20/2020 1.2    Glomerular Filtration Ra* 09/20/2020 60 (!)   Calcium 09/20/2020 9.3    AST  09/20/2020 26    ALT  09/20/2020 23    Alk Phos (alkaline Phosp* 09/20/2020 74    Albumin 09/20/2020 4.2    Bilirubin, Total 09/20/2020 0.4    Protein, Total 09/20/2020 6.3    A/G Ratio 09/20/2020 2.0      ASSESSMENT: Samuel Solis is a 70 y.o. male presenting for consultation for left inguinal hernia.    The patient presents with a symptomatic, reducible left inguinal hernia. Patient was oriented about the diagnosis of inguinal hernia and its implication. The patient was oriented about the treatment alternatives (observation vs surgical repair). Due to patient symptoms, repair is recommended. Patient oriented about the surgical procedure, the use of mesh and its risk of complications such as: infection, bleeding, injury to vas deference, vasculature and testicle, injury to bowel or bladder, and chronic pain.   Non-recurrent unilateral inguinal hernia without obstruction or gangrene [K40.90]  PLAN: 1.  Robotic assisted laparoscopic left inguinal hernia repair with mesh (30131) 2.  CBC, CMP 3.  Avoid taking aspirin 5 days before procedure 4.  Contact us if has any question or concern.   Patient verbalized understanding, all questions were answered, and were agreeable with the plan outlined above.    Herbert Pun, MD  Electronically signed by Herbert Pun, MD

## 2020-09-20 NOTE — H&P (View-Only) (Signed)
PATIENT PROFILE: Samuel Solis is a 70 y.o. male who presents to the Clinic for evaluation of left inguinal hernia.  PCP:  None  HISTORY OF PRESENT ILLNESS: Samuel Solis reports started having swelling of the left inguinal hernia since a month ago.  He denies any significant pain.  There is no radiation.  There is no alleviating or aggravating factor.  He reports that the swelling goes away when he lays down.  As soon as he stands up with the swelling comes back.  He denies any episode of the abdominal distention nausea or vomiting.  He denies any abdominal surgeries.  PROBLEM LIST: Left inguinal hernia  GENERAL REVIEW OF SYSTEMS:   General ROS: negative for - chills, fatigue, fever, weight gain or weight loss Allergy and Immunology ROS: negative for - hives  Hematological and Lymphatic ROS: negative for - bleeding problems or bruising, negative for palpable nodes Endocrine ROS: negative for - heat or cold intolerance, hair changes Respiratory ROS: negative for - cough, shortness of breath or wheezing Cardiovascular ROS: no chest pain or palpitations GI ROS: negative for nausea, vomiting, abdominal pain, diarrhea, constipation Musculoskeletal ROS: negative for - joint swelling or muscle pain Neurological ROS: negative for - confusion, syncope Dermatological ROS: negative for pruritus and rash Psychiatric: negative for anxiety, depression, difficulty sleeping and memory loss  MEDICATIONS: No current outpatient medications on file.   No current facility-administered medications for this visit.    ALLERGIES: Patient has no known allergies.  PAST MEDICAL HISTORY: History reviewed. No pertinent past medical history.  PAST SURGICAL HISTORY: History reviewed. No pertinent surgical history.   FAMILY HISTORY: Family History  Problem Relation Age of Onset   Lung cancer Mother    Lung cancer Father      SOCIAL HISTORY: Social History   Socioeconomic History   Marital status:  Divorced  Tobacco Use   Smoking status: Never Smoker   Smokeless tobacco: Never Used  Scientific laboratory technician Use: Never used  Substance and Sexual Activity   Alcohol use: Never   Drug use: Never    PHYSICAL EXAM: Vitals:   09/20/20 0843  BP: (!) 162/87  Pulse: 61   Body mass index is 23.43 kg/m. Weight: 76.2 kg (168 lb)   GENERAL: Alert, active, oriented x3  HEENT: Pupils equal reactive to light. Extraocular movements are intact. Sclera clear. Palpebral conjunctiva normal red color.Pharynx clear.  NECK: Supple with no palpable mass and no adenopathy.  LUNGS: Sound clear with no rales rhonchi or wheezes.  HEART: Regular rhythm S1 and S2 without murmur.  ABDOMEN: Soft and depressible, nontender with no palpable mass, no hepatomegaly.  Palpable and reducible left inguinal hernia with minimal tenderness to palpation.  EXTREMITIES: Well-developed well-nourished symmetrical with no dependent edema.  NEUROLOGICAL: Awake alert oriented, facial expression symmetrical, moving all extremities.  REVIEW OF DATA: I have reviewed the following data today: Initial consult on 09/20/2020  Component Date Value   WBC (White Blood Cell Co* 09/20/2020 4.7    RBC (Red Blood Cell Coun* 09/20/2020 4.85    Hemoglobin 09/20/2020 14.4    Hematocrit 09/20/2020 41.9    MCV (Mean Corpuscular Vo* 09/20/2020 86.4    MCH (Mean Corpuscular He* 09/20/2020 29.7    MCHC (Mean Corpuscular H* 09/20/2020 34.4    Platelet Count 09/20/2020 142 (!)   RDW-CV (Red Cell Distrib* 09/20/2020 13.6    MPV (Mean Platelet Volum* 09/20/2020 8.9 (!)   Neutrophils 09/20/2020 3.04    Lymphocytes 09/20/2020 1.05  Monocytes 09/20/2020 0.42    Eosinophils 09/20/2020 0.19    Basophils 09/20/2020 0.03    Neutrophil % 09/20/2020 64.1    Lymphocyte % 09/20/2020 22.2    Monocyte % 09/20/2020 8.9    Eosinophil % 09/20/2020 4.0    Basophil% 09/20/2020 0.6    Immature Granulocyte % 09/20/2020 0.2    Immature Granulocyte  Cou* 09/20/2020 0.01    Glucose 09/20/2020 99    Sodium 09/20/2020 142    Potassium 09/20/2020 4.4    Chloride 09/20/2020 107    Carbon Dioxide (CO2) 09/20/2020 33.1 (!)   Urea Nitrogen (BUN) 09/20/2020 18    Creatinine 09/20/2020 1.2    Glomerular Filtration Ra* 09/20/2020 60 (!)   Calcium 09/20/2020 9.3    AST  09/20/2020 26    ALT  09/20/2020 23    Alk Phos (alkaline Phosp* 09/20/2020 74    Albumin 09/20/2020 4.2    Bilirubin, Total 09/20/2020 0.4    Protein, Total 09/20/2020 6.3    A/G Ratio 09/20/2020 2.0      ASSESSMENT: Samuel Solis is a 70 y.o. male presenting for consultation for left inguinal hernia.    The patient presents with a symptomatic, reducible left inguinal hernia. Patient was oriented about the diagnosis of inguinal hernia and its implication. The patient was oriented about the treatment alternatives (observation vs surgical repair). Due to patient symptoms, repair is recommended. Patient oriented about the surgical procedure, the use of mesh and its risk of complications such as: infection, bleeding, injury to vas deference, vasculature and testicle, injury to bowel or bladder, and chronic pain.   Non-recurrent unilateral inguinal hernia without obstruction or gangrene [K40.90]  PLAN: 1.  Robotic assisted laparoscopic left inguinal hernia repair with mesh (30131) 2.  CBC, CMP 3.  Avoid taking aspirin 5 days before procedure 4.  Contact us if has any question or concern.   Patient verbalized understanding, all questions were answered, and were agreeable with the plan outlined above.    Herbert Pun, MD  Electronically signed by Herbert Pun, MD

## 2020-09-24 ENCOUNTER — Other Ambulatory Visit: Payer: Self-pay

## 2020-09-24 ENCOUNTER — Other Ambulatory Visit
Admission: RE | Admit: 2020-09-24 | Discharge: 2020-09-24 | Disposition: A | Discharge status: Home / Self Care | Payer: Medicare Other | Source: Ambulatory Visit | Attending: General Surgery | Admitting: General Surgery

## 2020-09-24 DIAGNOSIS — R54 Age-related physical debility: Secondary | ICD-10-CM | POA: Diagnosis not present

## 2020-09-24 DIAGNOSIS — Z01810 Encounter for preprocedural cardiovascular examination: Secondary | ICD-10-CM | POA: Diagnosis not present

## 2020-09-24 MED ORDER — ORAL CARE MOUTH RINSE: 15.0000 mL | Freq: Once | OROMUCOSAL | Status: AC

## 2020-09-24 MED ORDER — CEFAZOLIN SODIUM-DEXTROSE 2-4 GM/100ML-% IV SOLN
2.0000 g | INTRAVENOUS | Status: AC
  Administered 2020-09-25: 2 g via INTRAVENOUS

## 2020-09-24 MED ORDER — LACTATED RINGERS IV SOLN: INTRAVENOUS | Status: DC

## 2020-09-24 MED ORDER — FAMOTIDINE 20 MG PO TABS: 20.0000 mg | ORAL_TABLET | Freq: Once | ORAL | Status: AC

## 2020-09-24 MED ORDER — CHLORHEXIDINE GLUCONATE 0.12 % MT SOLN: 15.0000 mL | Freq: Once | OROMUCOSAL | Status: AC

## 2020-09-24 NOTE — Patient Instructions (Addendum)
Your procedure is scheduled on: Wednesday, July 20 Report to the Registration Desk on the 1st floor of the [facility] To find out your arrival time, please call [phone] between 1PM - 3PM on: Tuesday, July 19  REMEMBER: Instructions that are not followed completely may result in serious medical risk, up to and including death; or upon the discretion of your surgeon and anesthesiologist your surgery may need to be rescheduled.  Do not eat food after midnight the night before surgery.  No gum chewing, lozengers or hard candies.  You may however, drink CLEAR liquids up to 2 hours before you are scheduled to arrive for your surgery. Do not drink anything within 2 hours of your scheduled arrival time.  Clear liquids include: - water  - apple juice without pulp - gatorade (not RED, PURPLE, OR BLUE) - black coffee or tea (Do NOT add milk or creamers to the coffee or tea) Do NOT drink anything that is not on this list.  DO NOT TAKE ANY MEDICATIONS THE MORNING OF SURGERY  One week prior to surgery: Stop Anti-inflammatories (NSAIDS) such as Advil, Aleve, Ibuprofen, Motrin, Naproxen, Naprosyn and Aspirin based products such as Excedrin, Goodys Powder, BC Powder. Stop ANY OVER THE COUNTER supplements until after surgery. You may however, continue to take Tylenol if needed for pain up until the day of surgery.  No Alcohol for 24 hours before or after surgery.  No Smoking including e-cigarettes for 24 hours prior to surgery.  No chewable tobacco products for at least 6 hours prior to surgery.  No nicotine patches on the day of surgery.  Do not use any "recreational" drugs for at least a week prior to your surgery.  Please be advised that the combination of cocaine and anesthesia may have negative outcomes, up to and including death. If you test positive for cocaine, your surgery will be cancelled.  On the morning of surgery brush your teeth with toothpaste and water, you may rinse your  mouth with mouthwash if you wish. Do not swallow any toothpaste or mouthwash.  Do not wear jewelry, make-up, hairpins, clips or nail polish.  Do not wear lotions, powders, or perfumes.   Do not shave body from the neck down 48 hours prior to surgery just in case you cut yourself which could leave a site for infection.  Also, freshly shaved skin may become irritated if using the CHG soap.  Contact lenses, hearing aids and dentures may not be worn into surgery.  Do not bring valuables to the hospital. [facility] is not responsible for any missing/lost belongings or valuables.   Use CHG Soap as directed on instruction sheet.  Notify your doctor if there is any change in your medical condition (cold, fever, infection).  Wear comfortable clothing (specific to your surgery type) to the hospital.  After surgery, you can help prevent lung complications by doing breathing exercises.  Take deep breaths and cough every 1-2 hours. Your doctor may order a device called an Incentive Spirometer to help you take deep breaths. When coughing or sneezing, hold a pillow firmly against your incision with both hands. This is called "splinting." Doing this helps protect your incision. It also decreases belly discomfort.  If you are being discharged the day of surgery, you will not be allowed to drive home. You will need a responsible adult (18 years or older) to drive you home and stay with you that night.   If you are taking public transportation, you will  need to have a responsible adult (18 years or older) with you. Please confirm with your physician that it is acceptable to use public transportation.   Please call the Pre-admissions Testing Dept. at [phone] if you have any questions about these instructions.  Surgery Visitation Policy:  Patients undergoing a surgery or procedure may have one family member or support person with them as long as that person is not COVID-19 positive or  experiencing its symptoms.  That person may remain in the waiting area during the procedure.

## 2020-09-25 ENCOUNTER — Encounter
Admission: RE | Disposition: A | Discharge status: Home / Self Care | Payer: Self-pay | Source: Home / Self Care | Attending: General Surgery

## 2020-09-25 ENCOUNTER — Encounter: Payer: Self-pay | Admitting: General Surgery

## 2020-09-25 ENCOUNTER — Ambulatory Visit: Payer: Self-pay | Admitting: Anesthesiology

## 2020-09-25 ENCOUNTER — Ambulatory Visit
Admission: RE | Admit: 2020-09-25 | Discharge: 2020-09-25 | Disposition: A | Discharge status: Home / Self Care | Payer: Self-pay | Attending: General Surgery | Admitting: General Surgery

## 2020-09-25 DIAGNOSIS — Z635 Disruption of family by separation and divorce: Secondary | ICD-10-CM | POA: Insufficient documentation

## 2020-09-25 DIAGNOSIS — K4090 Unilateral inguinal hernia, without obstruction or gangrene, not specified as recurrent: Secondary | ICD-10-CM | POA: Insufficient documentation

## 2020-09-25 HISTORY — PX: XI ROBOTIC ASSISTED INGUINAL HERNIA REPAIR WITH MESH: SHX6706

## 2020-09-25 SURGERY — REPAIR, HERNIA, INGUINAL, ROBOT-ASSISTED, LAPAROSCOPIC, USING MESH
Anesthesia: General | Site: Groin | Laterality: Left

## 2020-09-25 MED ORDER — LIDOCAINE HCL (CARDIAC) PF 100 MG/5ML IV SOSY
PREFILLED_SYRINGE | INTRAVENOUS | Status: DC | PRN
  Administered 2020-09-25: 100 mg via INTRAVENOUS

## 2020-09-25 MED ORDER — PROPOFOL 10 MG/ML IV BOLUS
INTRAVENOUS | Status: AC
  Filled 2020-09-25: qty 20

## 2020-09-25 MED ORDER — FENTANYL CITRATE (PF) 100 MCG/2ML IJ SOLN
INTRAMUSCULAR | Status: AC
  Filled 2020-09-25: qty 2

## 2020-09-25 MED ORDER — ACETAMINOPHEN 10 MG/ML IV SOLN
INTRAVENOUS | Status: DC | PRN
  Administered 2020-09-25: 1000 mg via INTRAVENOUS

## 2020-09-25 MED ORDER — GLYCOPYRROLATE 0.2 MG/ML IJ SOLN
INTRAMUSCULAR | Status: DC | PRN
  Administered 2020-09-25: .2 mg via INTRAVENOUS

## 2020-09-25 MED ORDER — FENTANYL CITRATE (PF) 100 MCG/2ML IJ SOLN
INTRAMUSCULAR | Status: DC | PRN
  Administered 2020-09-25 (×3): 50 ug via INTRAVENOUS

## 2020-09-25 MED ORDER — MIDAZOLAM HCL 2 MG/2ML IJ SOLN
INTRAMUSCULAR | Status: DC | PRN
  Administered 2020-09-25: 2 mg via INTRAVENOUS

## 2020-09-25 MED ORDER — MIDAZOLAM HCL 2 MG/2ML IJ SOLN
INTRAMUSCULAR | Status: AC
  Filled 2020-09-25: qty 2

## 2020-09-25 MED ORDER — PROPOFOL 10 MG/ML IV BOLUS
INTRAVENOUS | Status: DC | PRN
  Administered 2020-09-25: 150 mg via INTRAVENOUS

## 2020-09-25 MED ORDER — ROCURONIUM BROMIDE 100 MG/10ML IV SOLN
INTRAVENOUS | Status: DC | PRN
  Administered 2020-09-25: 10 mg via INTRAVENOUS
  Administered 2020-09-25: 20 mg via INTRAVENOUS
  Administered 2020-09-25: 50 mg via INTRAVENOUS

## 2020-09-25 MED ORDER — BUPIVACAINE-EPINEPHRINE 0.25% -1:200000 IJ SOLN
INTRAMUSCULAR | Status: DC | PRN
  Administered 2020-09-25: 30 mL

## 2020-09-25 MED ORDER — ONDANSETRON HCL 4 MG/2ML IJ SOLN
INTRAMUSCULAR | Status: DC | PRN
  Administered 2020-09-25 (×2): 4 mg via INTRAVENOUS

## 2020-09-25 MED ORDER — SUCCINYLCHOLINE CHLORIDE 20 MG/ML IJ SOLN
INTRAMUSCULAR | Status: DC | PRN
  Administered 2020-09-25: 100 mg via INTRAVENOUS

## 2020-09-25 MED ORDER — BUPIVACAINE-EPINEPHRINE (PF) 0.25% -1:200000 IJ SOLN
INTRAMUSCULAR | Status: AC
  Filled 2020-09-25: qty 30

## 2020-09-25 MED ORDER — ONDANSETRON HCL 4 MG/2ML IJ SOLN: 4.0000 mg | Freq: Once | INTRAMUSCULAR | Status: DC | PRN

## 2020-09-25 MED ORDER — ACETAMINOPHEN 10 MG/ML IV SOLN
INTRAVENOUS | Status: AC
  Filled 2020-09-25: qty 100

## 2020-09-25 MED ORDER — SUGAMMADEX SODIUM 500 MG/5ML IV SOLN
INTRAVENOUS | Status: DC | PRN
  Administered 2020-09-25: 151.6 mg via INTRAVENOUS

## 2020-09-25 MED ORDER — MEPERIDINE HCL 25 MG/ML IJ SOLN: 6.2500 mg | INTRAMUSCULAR | Status: DC | PRN

## 2020-09-25 MED ORDER — HYDROCODONE-ACETAMINOPHEN 5-325 MG PO TABS: 1.0000 | ORAL_TABLET | ORAL | 0 refills | Status: AC | PRN

## 2020-09-25 MED ORDER — FENTANYL CITRATE (PF) 100 MCG/2ML IJ SOLN: 25.0000 ug | INTRAMUSCULAR | Status: DC | PRN

## 2020-09-25 MED ORDER — FAMOTIDINE 20 MG PO TABS
ORAL_TABLET | ORAL | Status: AC
  Administered 2020-09-25: 20 mg via ORAL
  Filled 2020-09-25: qty 1

## 2020-09-25 MED ORDER — CHLORHEXIDINE GLUCONATE 0.12 % MT SOLN
OROMUCOSAL | Status: AC
  Administered 2020-09-25: 15 mL via OROMUCOSAL
  Filled 2020-09-25: qty 15

## 2020-09-25 MED ORDER — DEXAMETHASONE SODIUM PHOSPHATE 10 MG/ML IJ SOLN
INTRAMUSCULAR | Status: DC | PRN
  Administered 2020-09-25: 10 mg via INTRAVENOUS

## 2020-09-25 MED ORDER — CEFAZOLIN SODIUM-DEXTROSE 2-4 GM/100ML-% IV SOLN
INTRAVENOUS | Status: AC
  Filled 2020-09-25: qty 100

## 2020-09-25 MED ORDER — 0.9 % SODIUM CHLORIDE (POUR BTL) OPTIME
TOPICAL | Status: DC | PRN
  Administered 2020-09-25: 500 mL

## 2020-09-25 SURGICAL SUPPLY — 53 items
ADH SKN CLS APL DERMABOND .7 (GAUZE/BANDAGES/DRESSINGS) ×1
APL PRP STRL LF DISP 70% ISPRP (MISCELLANEOUS) ×1
BAG INFUSER PRESSURE 100CC (MISCELLANEOUS) IMPLANT
BLADE SURG SZ11 CARB STEEL (BLADE) ×2 IMPLANT
CANISTER SUCT 1200ML W/VALVE (MISCELLANEOUS) ×2 IMPLANT
CHLORAPREP W/TINT 26 (MISCELLANEOUS) ×2 IMPLANT
COVER TIP SHEARS 8 DVNC (MISCELLANEOUS) ×1 IMPLANT
COVER TIP SHEARS 8MM DA VINCI (MISCELLANEOUS) ×1
COVER WAND RF STERILE (DRAPES) ×2 IMPLANT
DEFOGGER SCOPE WARMER CLEARIFY (MISCELLANEOUS) ×2 IMPLANT
DERMABOND ADVANCED (GAUZE/BANDAGES/DRESSINGS) ×1
DERMABOND ADVANCED .7 DNX12 (GAUZE/BANDAGES/DRESSINGS) ×1 IMPLANT
DRAPE ARM DVNC X/XI (DISPOSABLE) ×3 IMPLANT
DRAPE COLUMN DVNC XI (DISPOSABLE) ×1 IMPLANT
DRAPE DA VINCI XI ARM (DISPOSABLE) ×3
DRAPE DA VINCI XI COLUMN (DISPOSABLE) ×1
ELECT REM PT RETURN 9FT ADLT (ELECTROSURGICAL) ×2
ELECTRODE REM PT RTRN 9FT ADLT (ELECTROSURGICAL) ×1 IMPLANT
GAUZE 4X4 16PLY ~~LOC~~+RFID DBL (SPONGE) ×2 IMPLANT
GLOVE SURG ENC MOIS LTX SZ6.5 (GLOVE) ×4 IMPLANT
GLOVE SURG UNDER POLY LF SZ6.5 (GLOVE) ×4 IMPLANT
GOWN STRL REUS W/ TWL LRG LVL3 (GOWN DISPOSABLE) ×3 IMPLANT
GOWN STRL REUS W/TWL LRG LVL3 (GOWN DISPOSABLE) ×6
IRRIGATOR SUCT 8 DISP DVNC XI (IRRIGATION / IRRIGATOR) IMPLANT
IRRIGATOR SUCTION 8MM XI DISP (IRRIGATION / IRRIGATOR)
IV CATH ANGIO 12GX3 LT BLUE (NEEDLE) IMPLANT
IV NS 1000ML (IV SOLUTION)
IV NS 1000ML BAXH (IV SOLUTION) IMPLANT
KIT PINK PAD W/HEAD ARE REST (MISCELLANEOUS) ×2
KIT PINK PAD W/HEAD ARM REST (MISCELLANEOUS) ×1 IMPLANT
LABEL OR SOLS (LABEL) IMPLANT
MANIFOLD NEPTUNE II (INSTRUMENTS) ×2 IMPLANT
MESH 3DMAX 4X6 LT LRG (Mesh General) ×1 IMPLANT
MESH 3DMAX MID 4X6 LT LRG (Mesh General) IMPLANT
NDL INSUFFLATION 14GA 120MM (NEEDLE) ×1 IMPLANT
NEEDLE HYPO 22GX1.5 SAFETY (NEEDLE) ×2 IMPLANT
NEEDLE INSUFFLATION 14GA 120MM (NEEDLE) ×2 IMPLANT
OBTURATOR OPTICAL STANDARD 8MM (TROCAR) ×1
OBTURATOR OPTICAL STND 8 DVNC (TROCAR) ×1
OBTURATOR OPTICALSTD 8 DVNC (TROCAR) ×1 IMPLANT
PACK LAP CHOLECYSTECTOMY (MISCELLANEOUS) ×2 IMPLANT
SEAL CANN UNIV 5-8 DVNC XI (MISCELLANEOUS) ×3 IMPLANT
SEAL XI 5MM-8MM UNIVERSAL (MISCELLANEOUS) ×3
SET TUBE SMOKE EVAC HIGH FLOW (TUBING) ×2 IMPLANT
SOLUTION ELECTROLUBE (MISCELLANEOUS) ×1 IMPLANT
SUT MNCRL 4-0 (SUTURE) ×2
SUT MNCRL 4-0 27XMFL (SUTURE) ×1
SUT VIC AB 2-0 SH 27 (SUTURE) ×2
SUT VIC AB 2-0 SH 27XBRD (SUTURE) ×1 IMPLANT
SUT VLOC 90 S/L VL9 GS22 (SUTURE) ×2 IMPLANT
SUTURE MNCRL 4-0 27XMF (SUTURE) ×1 IMPLANT
TAPE TRANSPORE STRL 2 31045 (GAUZE/BANDAGES/DRESSINGS) IMPLANT
TRAY FOLEY MTR SLVR 16FR STAT (SET/KITS/TRAYS/PACK) ×1 IMPLANT

## 2020-09-25 NOTE — Anesthesia Preprocedure Evaluation (Signed)
Anesthesia Evaluation  Patient identified by MRN, date of birth, ID band Patient awake    Reviewed: Allergy & Precautions, NPO status , Patient's Chart, lab work & pertinent test results  Airway Mallampati: II  TM Distance: >3 FB Neck ROM: Full    Dental no notable dental hx.    Pulmonary neg pulmonary ROS,    Pulmonary exam normal        Cardiovascular negative cardio ROS Normal cardiovascular exam     Neuro/Psych negative neurological ROS  negative psych ROS   GI/Hepatic negative GI ROS, Neg liver ROS,   Endo/Other  negative endocrine ROS  Renal/GU negative Renal ROS  negative genitourinary   Musculoskeletal negative musculoskeletal ROS (+)   Abdominal   Peds negative pediatric ROS (+)  Hematology negative hematology ROS (+)   Anesthesia Other Findings   Reproductive/Obstetrics negative OB ROS                             Anesthesia Physical Anesthesia Plan  ASA: 1  Anesthesia Plan: General   Post-op Pain Management:    Induction: Intravenous  PONV Risk Score and Plan: 2 and Propofol infusion and Midazolam  Airway Management Planned: Oral ETT  Additional Equipment:   Intra-op Plan:   Post-operative Plan: Extubation in OR  Informed Consent: I have reviewed the patients History and Physical, chart, labs and discussed the procedure including the risks, benefits and alternatives for the proposed anesthesia with the patient or authorized representative who has indicated his/her understanding and acceptance.       Plan Discussed with: CRNA, Anesthesiologist and Surgeon  Anesthesia Plan Comments:         Anesthesia Quick Evaluation

## 2020-09-25 NOTE — Interval H&P Note (Signed)
History and Physical Interval Note:  09/25/2020 12:53 PM  Samuel Solis  has presented today for surgery, with the diagnosis of K40.90 non recurrent unilateral inguinal hernia w/o obstruction or gangrene.  The various methods of treatment have been discussed with the patient and family. After consideration of risks, benefits and other options for treatment, the patient has consented to  Procedure(s): XI ROBOTIC ASSISTED INGUINAL HERNIA REPAIR WITH MESH (Left) as a surgical intervention.  The patient's history has been reviewed, patient examined, no change in status, stable for surgery.  I have reviewed the patient's chart and labs.  Questions were answered to the patient's satisfaction.     Carolan Shiver

## 2020-09-25 NOTE — Op Note (Signed)
Preoperative diagnosis: Left inguinal hernia.   Postoperative diagnosis: Left inguinal hernia.  Procedure: Robotic assisted Laparoscopic Transabdominal preperitoneal laparoscopic (TAPP) repair of left inguinal hernia.  Anesthesia: GETA  Surgeon: Dr. Hazle Quant  Wound Classification: Clean  Indications:  Patient is a 70 y.o. male developed a symptomatic left inguinal hernia. Repair was indicated.  Findings: 1. Left Inguinal hernia identified 2. Vas deferens and cord structures identified and preserved 3. Bard 3D Max mesh used for repair 4. Adequate hemostasis.   Description of procedure: The patient was taken to the operating room and the correct side of surgery was verified. The patient was placed supine with arms tucked at the sides. After obtaining adequate anesthesia, the patient's abdomen was prepped and draped in standard sterile fashion. The patient was placed in the Trendelenburg position. A time-out was completed verifying correct patient, procedure, site, positioning, and implant(s) and/or special equipment prior to beginning this procedure. A Veress needle was placed at the umbilicus and pneumoperitoneum created with insufflation of carbon dioxide to 15 mmHg. After the Veress needle was removed, an 8-mm trocar was placed on epigastric area and the 30 angled laparoscope inserted. Two 8-mm trocars were then placed lateral to the rectus sheath under direct visualization. Both inguinal regions were inspected and the median umbilical ligament, medial umbilical ligament, and lateral umbilical fold were identified.  The robotic arms were docked. The robotic scope was inserted and the pelvic area anatomy targeted.  The peritoneum was incised with scissors along a line 5 cm above the superior edge of the hernia defect, extending from the median umbilical ligament to the anterior superior iliac spine. The peritoneal flap was mobilized inferiorly using blunt and sharp dissection. The inferior  epigastric vessels were exposed and the pubic symphysis was identified. Cooper's ligament was dissected to its junction with the iliac vein. The dissection was continued inferiorly to the iliopubic tract, with care taken to avoid injury to the femoral branch of the genitofemoral nerve and the lateral femoral cutaneous nerve. The cord structures were parietalized. The hernia was identified and reduced by gentle traction.  The indirect hernia sac was noted mobilized from the cord structures and reduced into the peritoneal cavity.  A large piece of mesh was rolled longitudinally into a compact cylinder and passed through a trocar. The cylinder was placed along the inferior aspect of the working space and unrolled into place to completely cover the direct, indirect, and femoral spaces. The mesh was secured into place superiorly to the anterior abdominal wall and inferiorly and medially to Cooper's ligament with absorbable sutures. Care was taken to avoid the inferolateral triangles containing the iliac vessels and genital nerves. The peritoneal flap was closed over the mesh and secured with suture in similar positions of safety. After ensuring adequate hemostasis, the trocars were removed and the pneumoperitoneum allowed to escape. The trocar incisions were closed using monocryl and skin adhesive dressings applied.  The patient tolerated the procedure well and was taken to the postanesthesia care unit in stable condition.   Specimen: None  Complications: None  Estimated Blood Loss: 5 mL

## 2020-09-25 NOTE — Discharge Instructions (Addendum)
  Diet: Resume home heart healthy regular diet.   Activity: No heavy lifting >20 pounds (children, pets, laundry, garbage) or strenuous activity until follow-up, but light activity and walking are encouraged. Do not drive or drink alcohol if taking narcotic pain medications.  Wound care: May shower with soapy water and pat dry (do not rub incisions), but no baths or submerging incision underwater until follow-up. (no swimming)   Medications: Resume all home medications. For mild to moderate pain: acetaminophen (Tylenol) ***or ibuprofen (if no kidney disease). Combining Tylenol with alcohol can substantially increase your risk of causing liver disease. Narcotic pain medications, if prescribed, can be used for severe pain, though may cause nausea, constipation, and drowsiness. Do not combine Tylenol and Norco within a 6 hour period as Norco contains Tylenol. If you do not need the narcotic pain medication, you do not need to fill the prescription.  Call office ([phone]) at any time if any questions, worsening pain, fevers/chills, bleeding, drainage from incision site, or other concerns.   AMBULATORY SURGERY  DISCHARGE INSTRUCTIONS   The drugs that you were given will stay in your system until tomorrow so for the next 24 hours you should not:  Drive an automobile Make any legal decisions Drink any alcoholic beverage   You may resume regular meals tomorrow.  Today it is better to start with liquids and gradually work up to solid foods.  You may eat anything you prefer, but it is better to start with liquids, then soup and crackers, and gradually work up to solid foods.   Please notify your doctor immediately if you have any unusual bleeding, trouble breathing, redness and pain at the surgery site, drainage, fever, or pain not relieved by medication.    Additional Instructions:        Please contact your physician with any problems or Same Day Surgery at [phone], Monday  through Friday 6 am to 4 pm, or [facility] [place] Main number at [phone].  

## 2020-09-25 NOTE — Transfer of Care (Signed)
Immediate Anesthesia Transfer of Care Note  Patient: Samuel Solis  Procedure(s) Performed: XI ROBOTIC ASSISTED INGUINAL HERNIA REPAIR WITH MESH (Left: Groin)  Patient Location: PACU  Anesthesia Type:General  Level of Consciousness: sedated  Airway & Oxygen Therapy: Patient Spontanous Breathing and Patient connected to face mask oxygen  Post-op Assessment: Report given to RN and Post -op Vital signs reviewed and stable  Post vital signs: Reviewed and stable  Last Vitals:  Vitals Value Taken Time  BP 113/71 09/25/20 1436  Temp    Pulse 51 09/25/20 1438  Resp 10 09/25/20 1438  SpO2 100 % 09/25/20 1438  Vitals shown include unvalidated device data.  Last Pain:  Vitals:   09/25/20 1141  TempSrc: Temporal  PainSc: 0-No pain         Complications: No notable events documented.

## 2020-09-25 NOTE — Anesthesia Procedure Notes (Signed)
Procedure Name: Intubation Date/Time: 09/25/2020 1:47 PM Performed by: Nelda Marseille, CRNA Pre-anesthesia Checklist: Patient identified, Patient being monitored, Timeout performed, Emergency Drugs available and Suction available Patient Re-evaluated:Patient Re-evaluated prior to induction Oxygen Delivery Method: Circle system utilized Preoxygenation: Pre-oxygenation with 100% oxygen Induction Type: IV induction Ventilation: Mask ventilation without difficulty Laryngoscope Size: Mac, 3 and McGraph Grade View: Grade I Tube type: Oral Tube size: 7.5 mm Number of attempts: 1 Airway Equipment and Method: Stylet Placement Confirmation: ETT inserted through vocal cords under direct vision, positive ETCO2 and breath sounds checked- equal and bilateral Secured at: 21 cm Tube secured with: Tape Dental Injury: Teeth and Oropharynx as per pre-operative assessment

## 2020-09-26 ENCOUNTER — Encounter: Payer: Self-pay | Admitting: General Surgery

## 2020-09-26 NOTE — Anesthesia Postprocedure Evaluation (Signed)
Anesthesia Post Note  Patient: Samuel Solis  Procedure(s) Performed: XI ROBOTIC ASSISTED INGUINAL HERNIA REPAIR WITH MESH (Left: Groin)  Patient location during evaluation: PACU Anesthesia Type: General Level of consciousness: awake and alert, awake and oriented Pain management: pain level controlled Vital Signs Assessment: post-procedure vital signs reviewed and stable Respiratory status: spontaneous breathing, nonlabored ventilation and respiratory function stable Cardiovascular status: blood pressure returned to baseline and stable Postop Assessment: no apparent nausea or vomiting Anesthetic complications: no   No notable events documented.   Last Vitals:  Vitals:   09/25/20 1513 09/25/20 1524  BP: 127/80 132/82  Pulse: 64 71  Resp: (!) 9 16  Temp: (!) 36.3 C (!) 36.2 C  SpO2: 100% 100%    Last Pain:  Vitals:   09/25/20 1524  TempSrc: Temporal  PainSc: 4                  Manfred Arch

## 2022-06-24 IMAGING — US US PELVIS LIMITED
1 series · 14 of 25 positions shown · non-contrast
Comparison: None.

CLINICAL DATA: Area of swelling in the groin.

EXAM:
LIMITED ULTRASOUND OF PELVIS
TECHNIQUE: Limited transabdominal ultrasound examination of the pelvis was
performed.

[Series 1: us soft tissue upper extremity limited left (non-v · 32 acquisitions, 14 frames shown]
[im 1/32]
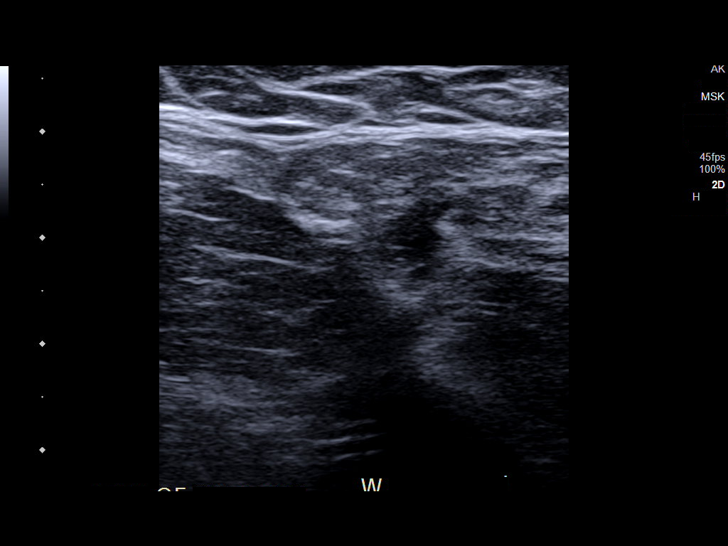
[im 3/32]
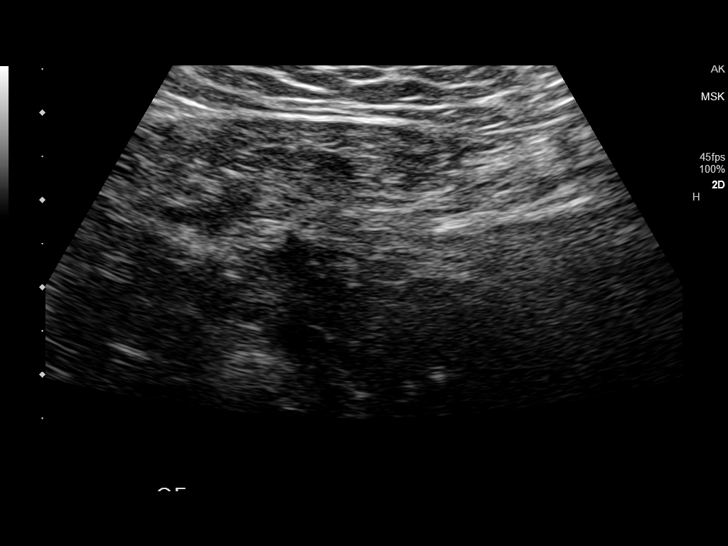
[im 6/32]
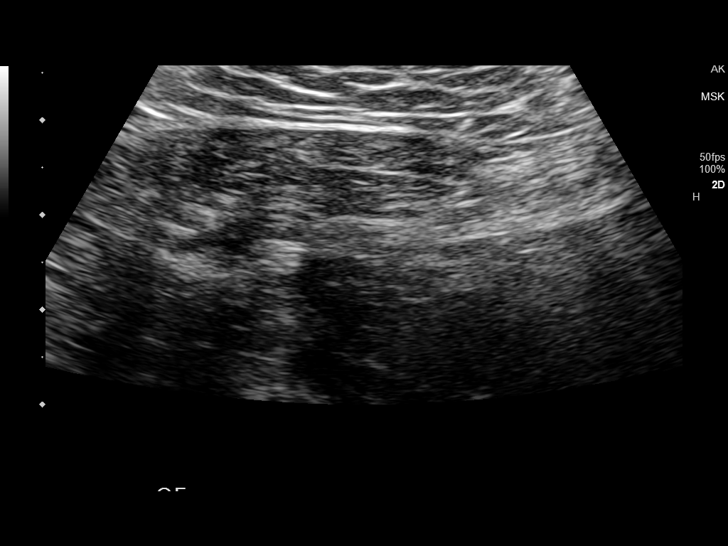
[im 8/32]
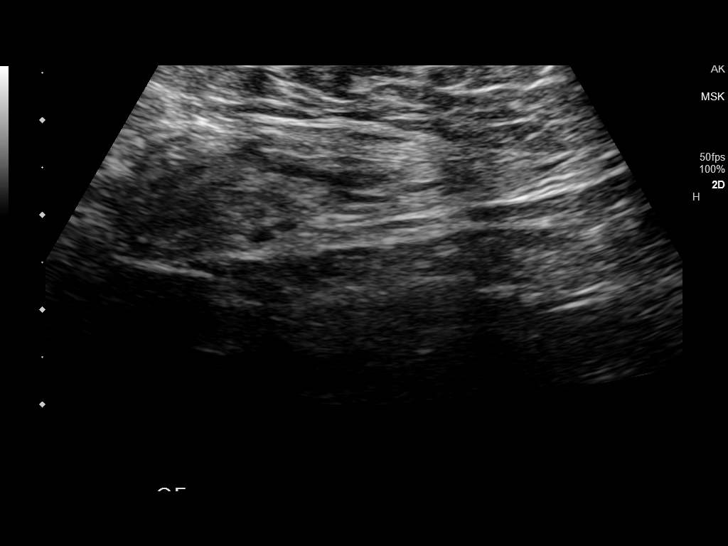
[im 11/32]
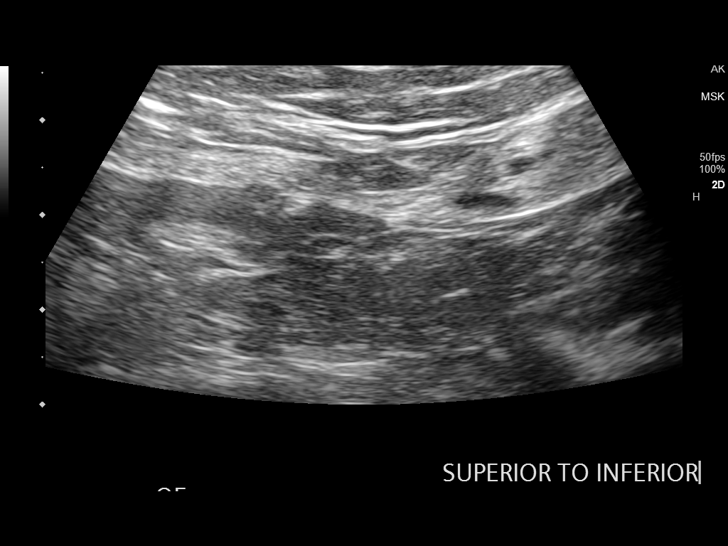
[im 12/32]
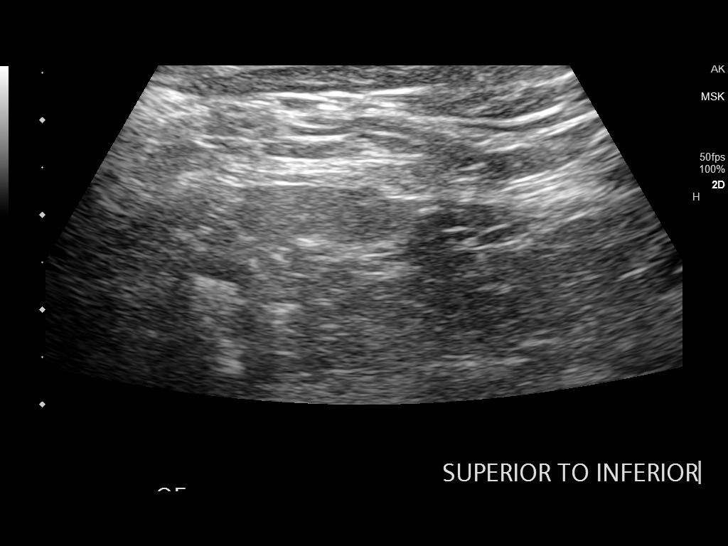
[im 15/32]
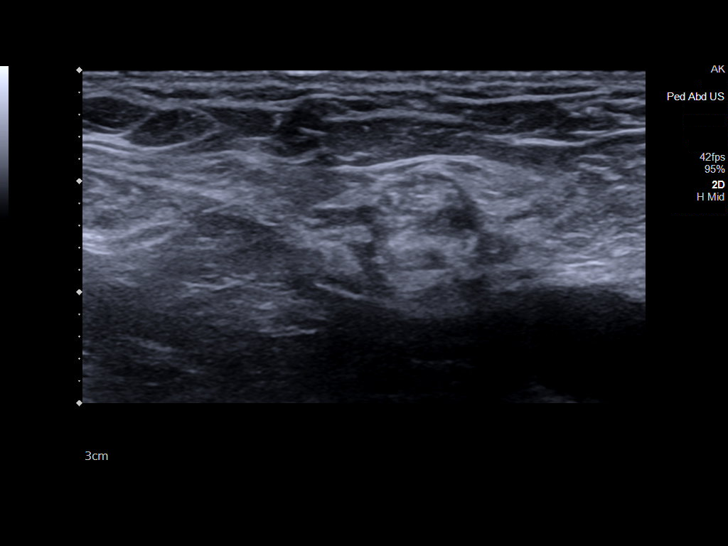
[im 17/32]
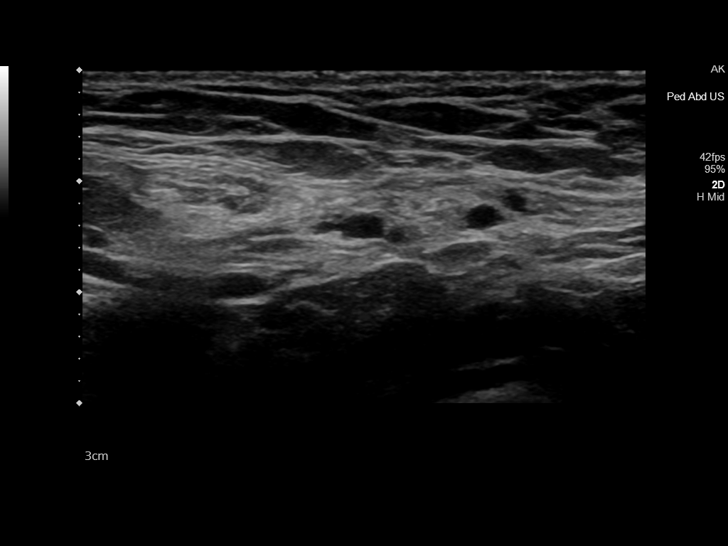
[im 20/32]
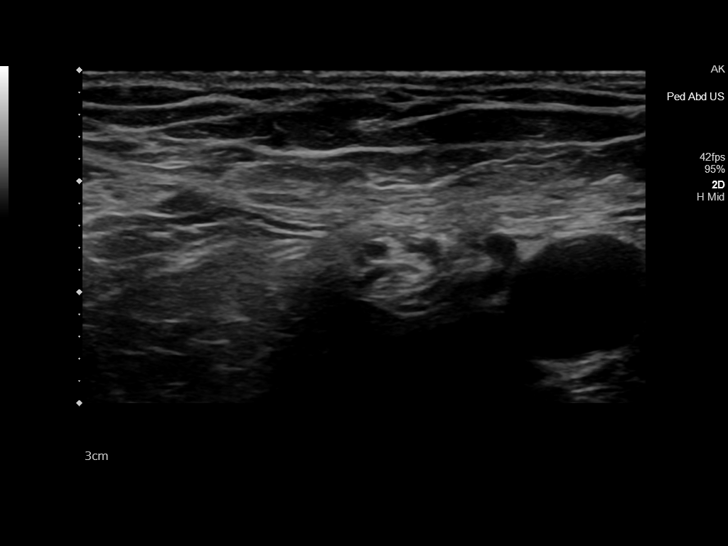
[im 21/32]
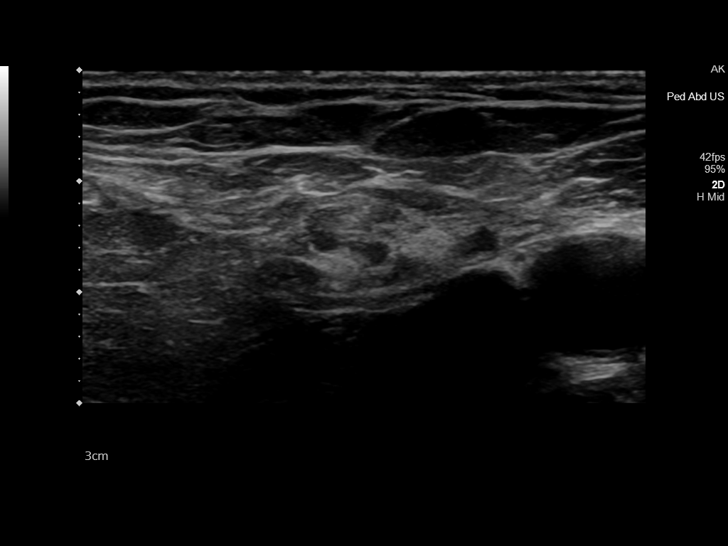
[im 24/32]
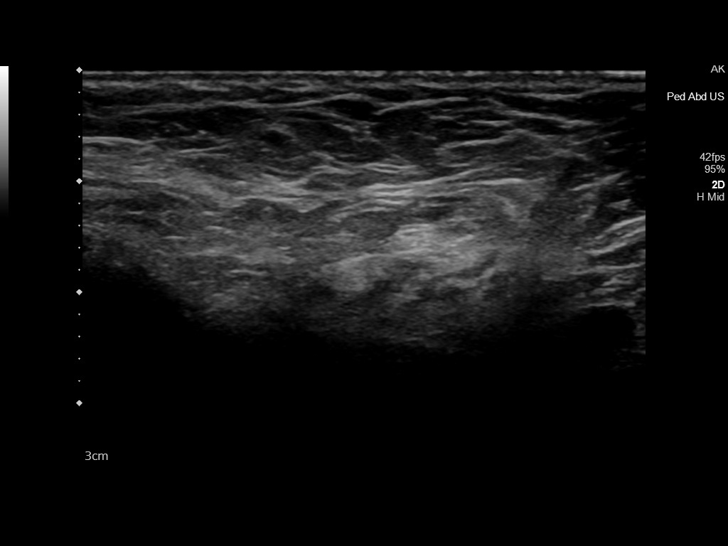
[im 26/32]
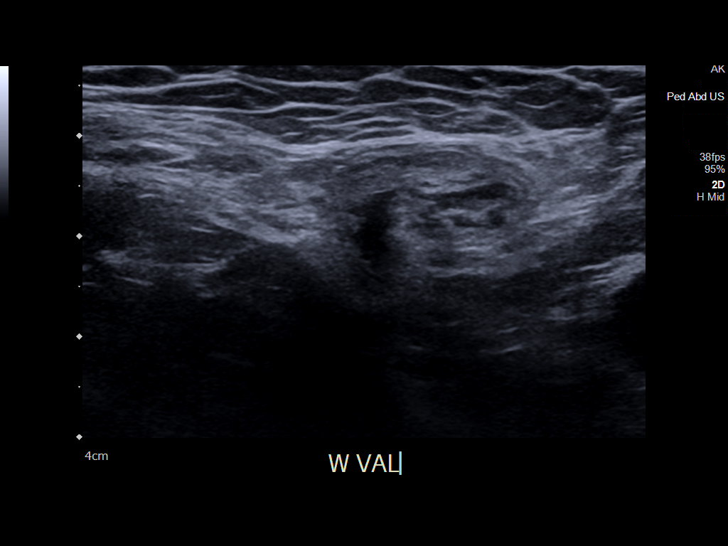
[im 29/32]
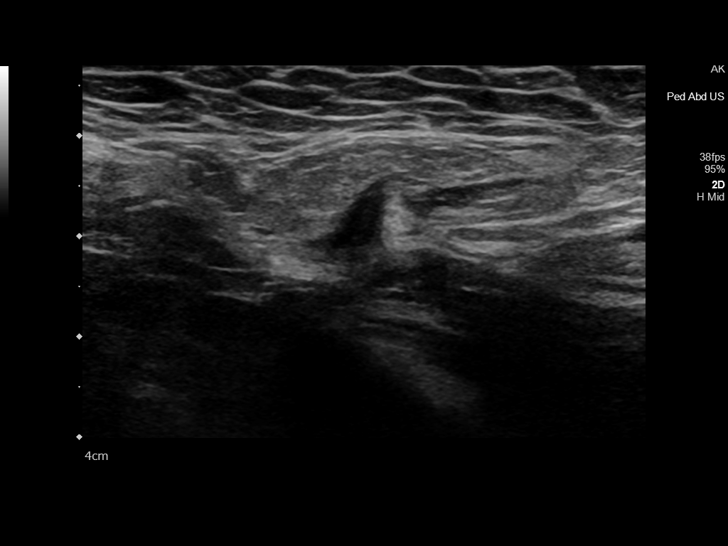
[im 32/32]
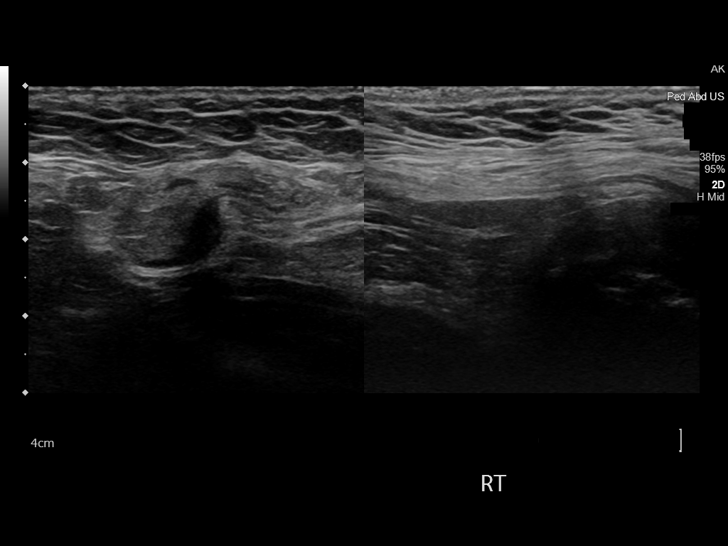

[14 of 25 positions shown; findings below may reference images not displayed]

FINDINGS: Area of soft tissue noted in the left groin most compatible with fat
containing hernia. This measures 3.1 x 2.3 x 1.1 cm. No visible
bowel. No other mass seen.
IMPRESSION: Probable small  fat containing hernia in the left groin.

## 2024-05-04 ENCOUNTER — Encounter
